# Patient Record
Sex: Female | Born: 1980 | Hispanic: Yes | Marital: Single | Smoking: Current every day smoker
Health system: Southern US, Community
[De-identification: ages and names within clinical notes are randomized; demographics above are authoritative.]

## PROBLEM LIST (undated history)

## (undated) DIAGNOSIS — R001 Bradycardia, unspecified: Secondary | ICD-10-CM

## (undated) DIAGNOSIS — J45909 Unspecified asthma, uncomplicated: Secondary | ICD-10-CM

---

## 2015-07-18 ENCOUNTER — Emergency Department (HOSPITAL_COMMUNITY)
Admission: EM | Admit: 2015-07-18 | Discharge: 2015-07-18 | Disposition: A | Discharge status: Home / Self Care | Payer: Medicaid Other | Attending: Emergency Medicine | Admitting: Emergency Medicine

## 2015-07-18 ENCOUNTER — Encounter (HOSPITAL_COMMUNITY): Payer: Self-pay | Admitting: Emergency Medicine

## 2015-07-18 ENCOUNTER — Emergency Department (HOSPITAL_COMMUNITY): Payer: Medicaid Other

## 2015-07-18 DIAGNOSIS — W108XXA Fall (on) (from) other stairs and steps, initial encounter: Secondary | ICD-10-CM | POA: Insufficient documentation

## 2015-07-18 DIAGNOSIS — S29001A Unspecified injury of muscle and tendon of front wall of thorax, initial encounter: Secondary | ICD-10-CM | POA: Diagnosis present

## 2015-07-18 DIAGNOSIS — Y9289 Other specified places as the place of occurrence of the external cause: Secondary | ICD-10-CM | POA: Insufficient documentation

## 2015-07-18 DIAGNOSIS — F17200 Nicotine dependence, unspecified, uncomplicated: Secondary | ICD-10-CM | POA: Insufficient documentation

## 2015-07-18 DIAGNOSIS — Y9389 Activity, other specified: Secondary | ICD-10-CM | POA: Diagnosis not present

## 2015-07-18 DIAGNOSIS — R0789 Other chest pain: Secondary | ICD-10-CM

## 2015-07-18 DIAGNOSIS — J45909 Unspecified asthma, uncomplicated: Secondary | ICD-10-CM | POA: Diagnosis not present

## 2015-07-18 DIAGNOSIS — W19XXXA Unspecified fall, initial encounter: Secondary | ICD-10-CM

## 2015-07-18 DIAGNOSIS — Y998 Other external cause status: Secondary | ICD-10-CM | POA: Insufficient documentation

## 2015-07-18 HISTORY — DX: Unspecified asthma, uncomplicated: J45.909

## 2015-07-18 LAB — I-STAT BETA HCG BLOOD, ED (MC, WL, AP ONLY)

## 2015-07-18 LAB — CBC WITH DIFFERENTIAL/PLATELET
BASOS PCT: 0 %
Basophils Absolute: 0 10*3/uL (ref 0.0–0.1)
EOS ABS: 0.3 10*3/uL (ref 0.0–0.7)
Eosinophils Relative: 3 %
HCT: 39.2 % (ref 36.0–46.0)
Hemoglobin: 13.2 g/dL (ref 12.0–15.0)
Lymphocytes Relative: 33 %
Lymphs Abs: 2.9 10*3/uL (ref 0.7–4.0)
MCH: 32.8 pg (ref 26.0–34.0)
MCHC: 33.7 g/dL (ref 30.0–36.0)
MCV: 97.3 fL (ref 78.0–100.0)
MONO ABS: 0.4 10*3/uL (ref 0.1–1.0)
Monocytes Relative: 4 %
NEUTROS PCT: 60 %
Neutro Abs: 5.3 10*3/uL (ref 1.7–7.7)
PLATELETS: 249 10*3/uL (ref 150–400)
RBC: 4.03 MIL/uL (ref 3.87–5.11)
RDW: 13.6 % (ref 11.5–15.5)
WBC: 8.9 10*3/uL (ref 4.0–10.5)

## 2015-07-18 LAB — BASIC METABOLIC PANEL
Anion gap: 8 (ref 5–15)
BUN: 8 mg/dL (ref 6–20)
CALCIUM: 9.7 mg/dL (ref 8.9–10.3)
CO2: 24 mmol/L (ref 22–32)
CREATININE: 0.58 mg/dL (ref 0.44–1.00)
Chloride: 105 mmol/L (ref 101–111)
GFR calc non Af Amer: >60 mL/min (ref >60)
Glucose, Bld: 90 mg/dL (ref 65–99)
Potassium: 4.3 mmol/L (ref 3.5–5.1)
SODIUM: 137 mmol/L (ref 135–145)

## 2015-07-18 MED ORDER — METHOCARBAMOL 500 MG PO TABS: 500.0000 mg | ORAL_TABLET | Freq: Two times a day (BID) | ORAL | Status: DC | PRN

## 2015-07-18 MED ORDER — HYDROCODONE-ACETAMINOPHEN 5-325 MG PO TABS
1.0000 | ORAL_TABLET | Freq: Once | ORAL | Status: AC
  Administered 2015-07-18: 1 via ORAL
  Filled 2015-07-18: qty 1

## 2015-07-18 MED ORDER — NAPROXEN 500 MG PO TABS: 500.0000 mg | ORAL_TABLET | Freq: Two times a day (BID) | ORAL | Status: DC

## 2015-07-18 MED ORDER — HYDROCODONE-ACETAMINOPHEN 5-325 MG PO TABS: 2.0000 | ORAL_TABLET | ORAL | Status: DC | PRN

## 2015-07-18 MED ORDER — KETOROLAC TROMETHAMINE 30 MG/ML IJ SOLN
30.0000 mg | Freq: Once | INTRAMUSCULAR | Status: AC
  Administered 2015-07-18: 30 mg via INTRAVENOUS
  Filled 2015-07-18: qty 1

## 2015-07-18 NOTE — ED Notes (Signed)
Pt stated that she slipped and fell down about four steps three days ago. Pt sustained injuries to her right flank and lower back. Pt denied a loss of consciousness before or after the incident.

## 2015-07-18 NOTE — ED Provider Notes (Signed)
CSN: [id number]     Arrival date & time 07/18/15  2018 History   First MD Initiated Contact with Patient 07/18/15 2021     Chief Complaint  Patient presents with  . Fall    HPI   Ms. Yolanda Chung is an 34 y.o. female with history of asthma who presents to the ED for evaluation of right sided pain following a fall. She states that three days ago she tripped and slid down some steps and since then has had right sided rib pain that is worse with movement and breathing. She states she has not noticed any bruising or swelling. She states she has been trying OTC NSAIDs with no relief. She denies SOB but states it hurts to take a deep breath. She states she has felt a little bit nauseated today due to the pain. Denies emesis. Pt states taht since the fall she has also had a cough that is productive of white phlegm. Denies fever, chills. Denies hitting her head or LOC. Denies weakness, difficulty walking, chest pain, abdominal pain.   Past Medical History  Diagnosis Date  . Asthma    History reviewed. No pertinent past surgical history. History reviewed. No pertinent family history. Social History  Substance Use Topics  . Smoking status: Current Every Day Smoker  . Smokeless tobacco: None  . Alcohol Use: No   OB History    No data available     Review of Systems  All other systems reviewed and are negative.     Allergies  Review of patient's allergies indicates no known allergies.  Home Medications   Prior to Admission medications   Medication Sig Start Date End Date Taking? Authorizing Provider  ibuprofen (ADVIL,MOTRIN) 200 MG tablet Take 400 mg by mouth every 6 (six) hours as needed for mild pain.   Yes Historical Provider, MD   Pulse 52  Temp(Src) 98.4 F (36.9 C) (Oral)  SpO2 100%  LMP 06/08/2015 Physical Exam  Constitutional: She is oriented to person, place, and time.  Thin, NAD  HENT:  Right Ear: External ear normal.  Left Ear: External ear normal.  Nose: Nose normal.   Mouth/Throat: Oropharynx is clear and moist. No oropharyngeal exudate.  Eyes: Conjunctivae and EOM are normal. Pupils are equal, round, and reactive to light.  Neck: Normal range of motion. Neck supple.  Cardiovascular: Normal rate, regular rhythm, normal heart sounds and intact distal pulses.   Pulmonary/Chest: Effort normal and breath sounds normal. No respiratory distress. She has no wheezes.  Pectus excavatum. Tenderness along right lateral side of chest.   Abdominal: Soft. Bowel sounds are normal. She exhibits no distension. There is no tenderness. There is no rebound and no guarding.  Musculoskeletal: Normal range of motion. She exhibits no edema.  Neurological: She is alert and oriented to person, place, and time. No cranial nerve deficit.  Skin: Skin is warm and dry.  Psychiatric: She has a normal mood and affect.  Nursing note and vitals reviewed.   ED Course  Procedures (including critical care time) Labs Review Labs Reviewed  BASIC METABOLIC PANEL  CBC WITH DIFFERENTIAL/PLATELET  I-STAT BETA HCG BLOOD, ED ([facility], [facility], [facility] ONLY)    Imaging Review Dg Chest 2 View  07/18/2015  CLINICAL DATA:  Initial encounter for Fall 3 days ago down the stiars. Pt c/o right lower rib and back pain. Pain with movement and inspiration. EXAM: CHEST  2 VIEW COMPARISON:  None. FINDINGS: Mild to moderate pectus excavatum deformity. Mild convex left thoracic  spine curvature. Midline trachea. Normal heart size and mediastinal contours. No pleural effusion or pneumothorax. EKG attachment artifacts identified projecting over both upper lobes. Clear lungs. IMPRESSION: No acute cardiopulmonary disease. Electronically Signed   By: Jeronimo Greaves M.D.   On: 07/18/2015 21:18   I have personally reviewed and evaluated these images and lab results as part of my medical decision-making.   EKG Interpretation None      MDM   Final diagnoses:  Fall, initial encounter  Chest wall pain   Pt presenting 3 days  after mechanical fall down stairs, now with right sided chest/rib pain. No gross deformity, swelling, or bruising. Pt is diffusely tender along lateral edge of chest. Will get CXR to r/o fracture, other lung injury. No abdominal tenderness. Will get basic labs as pt does also endorse nausea and productive cough. Will give norco and toradol for pain.  XR negative. Labs unremarkable. Likely contusion/soft tissue injury. Pt reports resolution of pain. Will give rx for norco, robaxin, and naproxen. ER return precautions given.    Carlene Coria, PA-C 07/18/15 2357  Arby Barrette, MD 07/20/15 [id number]

## 2015-07-18 NOTE — Discharge Instructions (Signed)
You were seen in the emergency room today for evaluation after a fall. Your x-ray was normal. As we discussed, your pain is likely due to swelling and bruising in the area. i will give you a few prescriptions to take home including Norco (strong painkiller), Robaxin (muscle relaxant), and naproxen (anti-inflammatory and pain killer). You may take them as needed. We also checked some bloodwork today which was normal. Please follow-up with your primary care provider within one week. Return to the ER for new or worsening symptoms.   Please obtain all of your results from medical records or have your doctors office obtain the results - share them with your doctor - you should be seen at your doctors office in the next 2 days. Call today to arrange your follow up. Take the medications as prescribed. Please review all of the medicines and only take them if you do not have an allergy to them. Please be aware that if you are taking birth control pills, taking other prescriptions, ESPECIALLY ANTIBIOTICS may make the birth control ineffective - if this is the case, either do not engage in sexual activity or use alternative methods of birth control such as condoms until you have finished the medicine and your family doctor says it is OK to restart them. If you are on a blood thinner such as COUMADIN, be aware that any other medicine that you take may cause the coumadin to either work too much, or not enough - you should have your coumadin level rechecked in next 7 days if this is the case.  ?  It is also a possibility that you have an allergic reaction to any of the medicines that you have been prescribed - Everybody reacts differently to medications and while MOST people have no trouble with most medicines, you may have a reaction such as nausea, vomiting, rash, swelling, shortness of breath. If this is the case, please stop taking the medicine immediately and contact your physician.  ?  You should return to the ER if  you develop severe or worsening symptoms.

## 2015-07-18 NOTE — ED Notes (Signed)
Pt left with all her belongings and ambulated out of the treatment area.  

## 2017-06-21 ENCOUNTER — Emergency Department (HOSPITAL_COMMUNITY)
Admission: EM | Admit: 2017-06-21 | Discharge: 2017-06-21 | Disposition: A | Discharge status: Home / Self Care | Payer: Self-pay | Attending: Emergency Medicine | Admitting: Emergency Medicine

## 2017-06-21 ENCOUNTER — Encounter (HOSPITAL_COMMUNITY): Payer: Self-pay | Admitting: Emergency Medicine

## 2017-06-21 ENCOUNTER — Other Ambulatory Visit: Payer: Self-pay

## 2017-06-21 ENCOUNTER — Emergency Department (HOSPITAL_COMMUNITY): Payer: Self-pay

## 2017-06-21 DIAGNOSIS — M62838 Other muscle spasm: Secondary | ICD-10-CM | POA: Insufficient documentation

## 2017-06-21 DIAGNOSIS — S161XXA Strain of muscle, fascia and tendon at neck level, initial encounter: Secondary | ICD-10-CM | POA: Insufficient documentation

## 2017-06-21 DIAGNOSIS — Y998 Other external cause status: Secondary | ICD-10-CM | POA: Insufficient documentation

## 2017-06-21 DIAGNOSIS — S0033XA Contusion of nose, initial encounter: Secondary | ICD-10-CM | POA: Insufficient documentation

## 2017-06-21 DIAGNOSIS — M25511 Pain in right shoulder: Secondary | ICD-10-CM | POA: Insufficient documentation

## 2017-06-21 DIAGNOSIS — Y9389 Activity, other specified: Secondary | ICD-10-CM | POA: Insufficient documentation

## 2017-06-21 DIAGNOSIS — Z79899 Other long term (current) drug therapy: Secondary | ICD-10-CM | POA: Insufficient documentation

## 2017-06-21 DIAGNOSIS — W06XXXA Fall from bed, initial encounter: Secondary | ICD-10-CM | POA: Insufficient documentation

## 2017-06-21 DIAGNOSIS — S0093XA Contusion of unspecified part of head, initial encounter: Secondary | ICD-10-CM | POA: Insufficient documentation

## 2017-06-21 DIAGNOSIS — Y92009 Unspecified place in unspecified non-institutional (private) residence as the place of occurrence of the external cause: Secondary | ICD-10-CM | POA: Insufficient documentation

## 2017-06-21 DIAGNOSIS — F17200 Nicotine dependence, unspecified, uncomplicated: Secondary | ICD-10-CM | POA: Insufficient documentation

## 2017-06-21 DIAGNOSIS — J45909 Unspecified asthma, uncomplicated: Secondary | ICD-10-CM | POA: Insufficient documentation

## 2017-06-21 DIAGNOSIS — S060X0A Concussion without loss of consciousness, initial encounter: Secondary | ICD-10-CM | POA: Insufficient documentation

## 2017-06-21 MED ORDER — NAPROXEN 500 MG PO TABS: 500.0000 mg | ORAL_TABLET | Freq: Two times a day (BID) | ORAL | 0 refills | Status: DC | PRN

## 2017-06-21 MED ORDER — KETOROLAC TROMETHAMINE 30 MG/ML IJ SOLN
30.0000 mg | Freq: Once | INTRAMUSCULAR | Status: AC
  Administered 2017-06-21: 30 mg via INTRAMUSCULAR
  Filled 2017-06-21: qty 1

## 2017-06-21 MED ORDER — CYCLOBENZAPRINE HCL 10 MG PO TABS: 10.0000 mg | ORAL_TABLET | Freq: Three times a day (TID) | ORAL | 0 refills | Status: DC | PRN

## 2017-06-21 NOTE — ED Triage Notes (Signed)
Pt states Thursday morning she got hit with an elbow on the right side of her face while sleeping on the top bunk of a bed, this made patient fall from top bunk landing on her right side. Pt c/o of back in right side of nose and upper back.

## 2017-06-21 NOTE — Discharge Instructions (Signed)
Alternate between naprosyn and Tylenol for pain. Get plenty of rest, use ice on your head.  Stay in a quiet, not simulating, dark environment. No TV, computer use, video games, or cell phone use until headache is resolved completely. Use heat to the area of soreness in your neck/shoulder area.  Use flexeril as directed as needed for muscle spasms, but don't drive or operate machinery while taking this medication. Follow Up with primary care physician in 5-7 days for recheck of symptoms.  Return to the emergency department if patient becomes lethargic, begins vomiting or other change in mental status, or any other changes/worsening symptoms.

## 2017-06-21 NOTE — ED Notes (Signed)
[email or web address] prescriptions printed for pt.

## 2017-06-21 NOTE — ED Provider Notes (Signed)
[facility]   CSN: [id number] Arrival date & time: 06/21/17  0820     History   Chief Complaint Chief Complaint  Patient presents with  . Facial Injury    HPI Yolanda Chung is a 36 y.o. female with a PMHx of asthma, who presents to the ED with complaints of right-sided headache/face pain times 2 days.  Patient states that she was sleeping on the top bunk with her child when she was accidentally elbowed on the right side of her nose, and this caused her to fall off the top bunk onto a carpeted floor landing on her face and right side.  She denies any LOC during the event.  She states that since then she has had 8/10 constant sharp nonradiating right periorbital/nasal headache, worse with palpation of the area, and minimally improved with ibuprofen.  She noticed some right-sided cheek and nose swelling which prompted her to come to the ER today.  She also complains of right-sided neck and shoulder pain where she fell.  She is not on any blood thinners.  She denies any vision changes, lightheadedness, LOC, fevers, chills, CP, SOB, abdominal pain, nausea, vomiting, diarrhea, constipation, dysuria, hematuria, incontinence of urine or stool, saddle anesthesia or cauda equina symptoms, numbness, tingling, focal weakness, or any other complaints at this time.  Of note, she does mention that she thought she was coming down with a cold earlier this week, and has had some rhinorrhea.    The history is provided by the patient and medical records. No language interpreter was used.  Facial Injury  Mechanism of injury:  Fall Location:  Nose and R cheek Time since incident:  2 days Pain details:    Quality:  Sharp   Severity:  Moderate   Duration:  2 days   Timing:  Constant   Progression:  Unchanged Foreign body present:  No foreign bodies Relieved by:  NSAIDs Worsened by:  Pressure Ineffective treatments:  None tried Associated symptoms:  headaches, neck pain and rhinorrhea   Associated symptoms: no loss of consciousness, no nausea and no vomiting     Past Medical History:  Diagnosis Date  . Asthma     There are no active problems to display for this patient.   History reviewed. No pertinent surgical history.  OB History    No data available       Home Medications    Prior to Admission medications   Medication Sig Start Date End Date Taking? Authorizing Provider  HYDROcodone-acetaminophen (NORCO/VICODIN) 5-325 MG tablet Take 2 tablets by mouth every 4 (four) hours as needed. 07/18/15   Sam, Ace GinsSerena Y, PA-C  ibuprofen (ADVIL,MOTRIN) 200 MG tablet Take 400 mg by mouth every 6 (six) hours as needed for mild pain.    [provider]  methocarbamol (ROBAXIN) 500 MG tablet Take 1 tablet (500 mg total) by mouth 2 (two) times daily as needed for muscle spasms. 07/18/15   Sam, Ace GinsSerena Y, PA-C  naproxen (NAPROSYN) 500 MG tablet Take 1 tablet (500 mg total) by mouth 2 (two) times daily. 07/18/15   Carlene CoriaSam, Serena Y, PA-C    Family History No family history on file.  Social History Social History   Tobacco Use  . Smoking status: Current Every Day Smoker  Substance Use Topics  . Alcohol use: No  . Drug use: No     Allergies   Patient has no known allergies.   Review of Systems Review of Systems  Constitutional: Negative for chills and fever.  HENT: Positive for facial swelling and rhinorrhea.   Eyes: Negative for visual disturbance.  Respiratory: Negative for shortness of breath.   Cardiovascular: Negative for chest pain.  Gastrointestinal: Negative for abdominal pain, constipation, diarrhea, nausea and vomiting.  Genitourinary: Negative for difficulty urinating (no incontinence), dysuria and hematuria.  Musculoskeletal: Positive for myalgias and neck pain. Negative for arthralgias.  Skin: Negative for color change.  Allergic/Immunologic: Negative for immunocompromised state.  Neurological: Positive  for headaches. Negative for loss of consciousness, syncope, weakness, light-headedness and numbness.  Hematological: Does not bruise/bleed easily.  Psychiatric/Behavioral: Negative for confusion.   All other systems reviewed and are negative for acute change except as noted in the HPI.    Physical Exam Updated Vital Signs BP 132/84 (BP Location: Right Arm)   Pulse (!) 59   Temp 98.6 F (37 C) (Oral)   Resp 16   Ht 4\' 10"  (1.473 m)   Wt 37.2 kg (82 lb)   SpO2 100%   BMI 17.14 kg/m   Physical Exam  Constitutional: She is oriented to person, place, and time. Vital signs are normal. She appears well-developed and well-nourished.  Non-toxic appearance. No distress.  Afebrile, nontoxic, NAD  HENT:  Head: Normocephalic. Head is with contusion. Head is without raccoon's eyes, without Battle's sign and without abrasion.  Nose: Sinus tenderness present. No nasal deformity, septal deviation or nasal septal hematoma. No epistaxis.  Mouth/Throat: Uvula is midline, oropharynx is clear and moist and mucous membranes are normal. No trismus in the jaw. No uvula swelling.  Slight swelling to R cheek/nasal bridge, mild TTP in this area, no bruising, no crepitus or deformity, no skull tenderness or bony step offs, no abrasions or lacerations, no raccoon eyes or battle's sign. No s/sx of basilar skull fx. No jaw tenderness, FROM intact at b/l TMJs, no malocclusion  Eyes: Conjunctivae and EOM are normal. Pupils are equal, round, and reactive to light. Right eye exhibits no discharge. Left eye exhibits no discharge.  PERRL, EOMI, no nystagmus, no visual field deficits   Neck: Normal range of motion. Neck supple. Spinous process tenderness and muscular tenderness present. No neck rigidity. Normal range of motion present.  FROM intact with diffuse midline spinous process TTP, no bony stepoffs or deformities, and with R sided paraspinous muscle TTP and muscle spasms. No rigidity or meningeal signs. No bruising  or swelling.   Cardiovascular: Normal rate and intact distal pulses.  Pulmonary/Chest: Effort normal. No respiratory distress.  Abdominal: Normal appearance. She exhibits no distension.  Musculoskeletal: Normal range of motion.  MAE x4 Strength and sensation grossly intact in all extremities Distal pulses intact Gait steady C-spine as above, all other spinal levels nonTTP without bony stepoffs or deformities   Neurological: She is alert and oriented to person, place, and time. She has normal strength. No cranial nerve deficit or sensory deficit. Coordination and gait normal. GCS eye subscore is 4. GCS verbal subscore is 5. GCS motor subscore is 6.  CN 2-12 grossly intact A&O x4 GCS 15 Sensation and strength intact Gait nonataxic including with tandem walking Coordination with finger-to-nose WNL Neg pronator drift   Skin: Skin is warm, dry and intact. No rash noted.  Psychiatric: She has a normal mood and affect.  Nursing note and vitals reviewed.    ED Treatments / Results  Labs (all labs ordered are listed, but only abnormal results are displayed) Labs Reviewed - No data to display  EKG  EKG Interpretation  None       Radiology Dg Cervical Spine Complete  Result Date: 06/21/2017 CLINICAL DATA:  Fall from bed.  Neck pain EXAM: CERVICAL SPINE - COMPLETE 4+ VIEW COMPARISON:  None. FINDINGS: Normal alignment. No fracture. Prevertebral soft tissues are normal. Disc spaces maintained. IMPRESSION: Negative cervical spine radiographs. Electronically Signed   By: Charlett NoseKevin  Dover M.D.   On: 06/21/2017 13:00    Procedures Procedures (including critical care time)  Medications Ordered in ED Medications  ketorolac (TORADOL) 30 MG/ML injection 30 mg (30 mg Intramuscular Given 06/21/17 1220)     Initial Impression / Assessment and Plan / ED Course  I have reviewed the triage vital signs and the nursing notes.  Pertinent labs & imaging results that were available during my care of  the patient were reviewed by me and considered in my medical decision making (see chart for details).     36 y.o. female here after she got elbowed in the nose/face and then fell out of the top bunk onto her face 2 days ago. No LOC. On exam, no focal neuro deficits, mild midline cervical spinous process TTP and R sided paracervical muscle TTP with spasm, mild tenderness to nasal bridge, no epistaxis or septum hematoma/deviation. No s/sx of basilar skull fx. Will get xray of C-spine but doubt need for head imaging. Will give toradol then reassess shortly.   2:17 PM Xray negative. Pt feeling better. Likely mild concussion and contusion of nose, and muscle strain in neck; advised concussion guidelines and use of tylenol/ice to face/heat to neck, will send home with flexeril and naprosyn rx's. Advised f/up with PCP in 1wk for recheck of symptoms. I explained the diagnosis and have given explicit precautions to return to the ER including for any other new or worsening symptoms. The patient understands and accepts the medical plan as it's been dictated and I have answered their questions. Discharge instructions concerning home care and prescriptions have been given. The patient is STABLE and is discharged to home in good condition.    Final Clinical Impressions(s) / ED Diagnoses   Final diagnoses:  Contusion of nose, initial encounter  Concussion without loss of consciousness, initial encounter  Neck muscle spasm    ED Discharge Orders        Ordered    naproxen (NAPROSYN) 500 MG tablet  2 times daily PRN     06/21/17 1331    cyclobenzaprine (FLEXERIL) 10 MG tablet  3 times daily PRN     06/21/17 [address]       Karan Ramnauth, EdgemontMercedes, [place] JerseyPA-C 06/21/17 1417    Cathren LaineSteinl, Kevin, MD 06/22/17 [phone]

## 2017-07-03 ENCOUNTER — Emergency Department (HOSPITAL_COMMUNITY): Payer: Self-pay

## 2017-07-03 ENCOUNTER — Encounter (HOSPITAL_COMMUNITY): Payer: Self-pay

## 2017-07-03 ENCOUNTER — Emergency Department (HOSPITAL_COMMUNITY)
Admission: EM | Admit: 2017-07-03 | Discharge: 2017-07-03 | Disposition: A | Discharge status: Home / Self Care | Payer: Self-pay | Attending: Emergency Medicine | Admitting: Emergency Medicine

## 2017-07-03 DIAGNOSIS — J189 Pneumonia, unspecified organism: Secondary | ICD-10-CM

## 2017-07-03 DIAGNOSIS — J181 Lobar pneumonia, unspecified organism: Secondary | ICD-10-CM | POA: Insufficient documentation

## 2017-07-03 DIAGNOSIS — F17210 Nicotine dependence, cigarettes, uncomplicated: Secondary | ICD-10-CM | POA: Insufficient documentation

## 2017-07-03 DIAGNOSIS — Z79899 Other long term (current) drug therapy: Secondary | ICD-10-CM | POA: Insufficient documentation

## 2017-07-03 DIAGNOSIS — J45909 Unspecified asthma, uncomplicated: Secondary | ICD-10-CM | POA: Insufficient documentation

## 2017-07-03 LAB — RAPID STREP SCREEN (MED CTR MEBANE ONLY): Streptococcus, Group A Screen (Direct): NEGATIVE

## 2017-07-03 MED ORDER — ALBUTEROL SULFATE HFA 108 (90 BASE) MCG/ACT IN AERS: 2.0000 | INHALATION_SPRAY | RESPIRATORY_TRACT | 0 refills | Status: DC | PRN

## 2017-07-03 MED ORDER — AZITHROMYCIN 250 MG PO TABS: 250.0000 mg | ORAL_TABLET | Freq: Every day | ORAL | 0 refills | Status: DC

## 2017-07-03 MED ORDER — IBUPROFEN 400 MG PO TABS: 400.0000 mg | ORAL_TABLET | Freq: Four times a day (QID) | ORAL | 0 refills | Status: DC | PRN

## 2017-07-03 NOTE — ED Provider Notes (Signed)
[facility] Provider [facility]   CSN: [id number] Arrival date & time: 07/03/17  0608     History   Chief Complaint Chief Complaint  Patient presents with  . Cough    HPI Yolanda Chung is a 36 y.o. female.  HPI   Yolanda Chung is a 36 y.o. female, with a history of asthma, presenting to the ED with productive cough for last five days. Accompanied by body aches, fever, and chills.  She has been taking Tylenol for discomfort. No recent hospitalizations or ABX use. Denies CP, SOB, vomiting/diarrhea, abdominal pain, or any other complaints.      Past Medical History:  Diagnosis Date  . Asthma     There are no active problems to display for this patient.   History reviewed. No pertinent surgical history.  OB History    No data available       Home Medications    Prior to Admission medications   Medication Sig Start Date End Date Taking? Authorizing Provider  albuterol (PROVENTIL HFA;VENTOLIN HFA) 108 (90 Base) MCG/ACT inhaler Inhale 2 puffs into the lungs every 4 (four) hours as needed for wheezing or shortness of breath. 07/03/17   Zykera Abella C, PA-C  azithromycin (ZITHROMAX) 250 MG tablet Take 1 tablet (250 mg total) by mouth daily. Take first 2 tablets together, then 1 every day until finished. 07/03/17   Maleena Eddleman C, PA-C  cyclobenzaprine (FLEXERIL) 10 MG tablet Take 1 tablet (10 mg total) 3 (three) times daily as needed by mouth for muscle spasms. 06/21/17   Street, SunmanMercedes, PA-C  HYDROcodone-acetaminophen (NORCO/VICODIN) 5-325 MG tablet Take 2 tablets by mouth every 4 (four) hours as needed. 07/18/15   Sam, Ace GinsSerena Y, PA-C  ibuprofen (ADVIL,MOTRIN) 200 MG tablet Take 400 mg by mouth every 6 (six) hours as needed for mild pain.    [provider]  ibuprofen (ADVIL,MOTRIN) 400 MG tablet Take 1 tablet (400 mg total) by mouth every 6 (six) hours as needed for fever, mild pain or moderate pain. 07/03/17   Radiah Lubinski C, PA-C    methocarbamol (ROBAXIN) 500 MG tablet Take 1 tablet (500 mg total) by mouth 2 (two) times daily as needed for muscle spasms. 07/18/15   Sam, Ace GinsSerena Y, PA-C  naproxen (NAPROSYN) 500 MG tablet Take 1 tablet (500 mg total) by mouth 2 (two) times daily. 07/18/15   Sam, Ace GinsSerena Y, PA-C  naproxen (NAPROSYN) 500 MG tablet Take 1 tablet (500 mg total) 2 (two) times daily as needed by mouth for mild pain, moderate pain or headache (TAKE WITH MEALS.). 06/21/17   Street, DelmarMercedes, PA-C    Family History No family history on file.  Social History Social History   Tobacco Use  . Smoking status: Current Every Day Smoker  . Smokeless tobacco: Never Used  Substance Use Topics  . Alcohol use: No  . Drug use: No     Allergies   Patient has no known allergies.   Review of Systems Review of Systems  Constitutional: Positive for chills and fever.  Respiratory: Positive for cough. Negative for shortness of breath.   Gastrointestinal: Negative for abdominal pain, diarrhea, nausea and vomiting.  All other systems reviewed and are negative.    Physical Exam Updated Vital Signs BP 110/73   Pulse 76   Temp 98.3 F (36.8 C) (Oral)   Resp 18   LMP 06/21/2017 (Exact Date)   SpO2 99%   Physical Exam  Constitutional: She appears well-developed and  well-nourished. No distress.  HENT:  Head: Normocephalic and atraumatic.  Eyes: Conjunctivae are normal.  Neck: Neck supple.  Cardiovascular: Normal rate, regular rhythm, normal heart sounds and intact distal pulses.  Pulmonary/Chest: Effort normal and breath sounds normal. No respiratory distress.  No increased work of breathing. Speaks in full sentences without difficulty.  Abdominal: Soft. There is no tenderness. There is no guarding.  Musculoskeletal: She exhibits no edema.  Lymphadenopathy:    She has no cervical adenopathy.  Neurological: She is alert.  Skin: Skin is warm and dry. Capillary refill takes less than 2 seconds. She is not  diaphoretic.  Psychiatric: She has a normal mood and affect. Her behavior is normal.  Nursing note and vitals reviewed.    ED Treatments / Results  Labs (all labs ordered are listed, but only abnormal results are displayed) Labs Reviewed  RAPID STREP SCREEN (NOT AT [facility])  CULTURE, GROUP A STREP [facility])    EKG  EKG Interpretation None       Radiology Dg Chest 2 View  Result Date: 07/03/2017 CLINICAL DATA:  Productive cough, fever and sore throat for 5 days. EXAM: CHEST  2 VIEW COMPARISON:  07/18/2015 FINDINGS: Borderline hyperinflation. The cardiomediastinal contours are normal. Small focal retrocardiac opacity in the left lung base likely localizing to the lingula on the lateral view. Pulmonary vasculature is normal. No pleural effusion or pneumothorax. No acute osseous abnormalities are seen. IMPRESSION: Small focal left lung base opacity suspicious for pneumonia. Electronically Signed   By: Rubye OaksMelanie  Ehinger M.D.   On: 07/03/2017 06:54    Procedures Procedures (including critical care time)  Medications Ordered in ED Medications - No data to display   Initial Impression / Assessment and Plan / ED Course  I have reviewed the triage vital signs and the nursing notes.  Pertinent labs & imaging results that were available during my care of the patient were reviewed by me and considered in my medical decision making (see chart for details).     Patient presents with productive cough and fever.  Evidence of pneumonia on chest x-ray.  Antibiotic therapy initiated.  PCP follow-up. The patient was given instructions for home care as well as return precautions. Patient voices understanding of these instructions, accepts the plan, and is comfortable with discharge.   Final Clinical Impressions(s) / ED Diagnoses   Final diagnoses:  Community acquired pneumonia of left lower lobe of lung [facility](HCC)    ED Discharge Orders        Ordered    ibuprofen (ADVIL,MOTRIN) 400 MG tablet   Every 6 hours PRN     07/03/17 0935    azithromycin (ZITHROMAX) 250 MG tablet  Daily     07/03/17 0935    albuterol (PROVENTIL HFA;VENTOLIN HFA) 108 (90 Base) MCG/ACT inhaler  Every 4 hours PRN     07/03/17 0935       Anselm PancoastJoy, Cherye Gaertner C, PA-C 07/03/17 0939    Gerhard MunchLockwood, Robert, MD 07/03/17 1550

## 2017-07-03 NOTE — ED Triage Notes (Addendum)
Pt reports productive cough, sore throat and fevers for 5 days associated with lack of appetite. She states she is getting worse every day. Temp at home 101.3 yesterday morning. Denies nausea/vomiting/diarrhea.

## 2017-07-03 NOTE — ED Notes (Signed)
Pt staets she understands instructions. Home stable with steady gait.

## 2017-07-03 NOTE — Discharge Instructions (Addendum)
You have evidence of pneumonia on the chest x-ray.  Please follow the instructions below.  Please take all of your antibiotics until finished!   You may develop abdominal discomfort or diarrhea from the antibiotic.  You may help offset this with probiotics which you can buy or get in yogurt. Do not eat or take the probiotics until 2 hours after your antibiotic.   Hand washing: Wash your hands throughout the day, but especially before and after touching the face, using the restroom, sneezing, coughing, or touching surfaces that have been coughed or sneezed upon. Hydration: Symptoms will be intensified and complicated by dehydration. Dehydration can also extend the duration of symptoms. Drink plenty of fluids and get plenty of rest. You should be drinking at least half a liter of water an hour to stay hydrated. Electrolyte drinks are also encouraged. You should be drinking enough fluids to make your urine light yellow, almost clear. If this is not the case, you are not drinking enough water. Please note that some of the treatments indicated below will not be effective if you are not adequately hydrated. Pain or fever: Ibuprofen, Naproxen, or Tylenol for pain or fever.  Antiinflammatory medications: Take 400 mg of ibuprofen every 6 hours for the next 3 days. After this time, this medication may be used as needed for pain. Take this medication with food to avoid upset stomach.  Tylenol: Should you continue to have additional pain while taking the ibuprofen, you may add in tylenol as needed. Your daily total maximum amount of tylenol from all sources should be limited to 4000mg /day for persons without liver problems, or 2000mg /day for those with liver problems. Albuterol: May use the albuterol, as needed, for mild shortness of breath. Congestion: Plain Mucinex may help relieve congestion. Saline sinus rinses and saline nasal sprays may also help relieve congestion. If you do not have heart problems or an  allergy to such medications, you may also try phenylephrine or Sudafed. Sore throat: Warm liquids or Chloraseptic spray may help soothe a sore throat. Gargle twice a day with a salt water solution made from a half teaspoon of salt in a cup of warm water.  Follow up: Follow up with a primary care provider, as needed, for any future management of this issue. Return: Return to the ED should symptoms worsen.

## 2017-07-05 ENCOUNTER — Ambulatory Visit (HOSPITAL_COMMUNITY)
Admission: EM | Admit: 2017-07-05 | Discharge: 2017-07-05 | Disposition: A | Discharge status: Home / Self Care | Payer: Self-pay | Attending: Internal Medicine | Admitting: Internal Medicine

## 2017-07-05 ENCOUNTER — Encounter (HOSPITAL_COMMUNITY): Payer: Self-pay | Admitting: Emergency Medicine

## 2017-07-05 DIAGNOSIS — J189 Pneumonia, unspecified organism: Secondary | ICD-10-CM

## 2017-07-05 DIAGNOSIS — J181 Lobar pneumonia, unspecified organism: Secondary | ICD-10-CM

## 2017-07-05 LAB — CULTURE, GROUP A STREP (THRC)

## 2017-07-05 NOTE — ED Triage Notes (Signed)
PT C/O: persistent cold/pneumonia... [facility] on 11/29   ONSET: 7 days   SX ALSO INCLUDE: prod cough, fevers  DENIES:   TAKING MEDS:   A&O x4... NAD... Ambulatory

## 2017-07-05 NOTE — Discharge Instructions (Addendum)
Please do not start smoking again.  Finish zithromax as prescribed in ED 11/29.  Note for work extended for 2 more days, until 07/07/2017; return to work on 07/08/2017.  Rest and push fluids.  Anticipate gradual improvement in cough/phlegm production/fever/energy level over the next 2-4 weeks; would not expect increasing phlegm production or persistent fever more than 3-4 more days.

## 2017-07-05 NOTE — ED Provider Notes (Signed)
[facility]    CSN: [id number] Arrival date & time: 07/05/17  1431     History   Chief Complaint Chief Complaint  Patient presents with  . Follow-up    Pneumonia     HPI Johna RolesGloria Tabone is a 36 y.o. female.   She presents today in follow-up from an ER visit 2 days ago, where she was diagnosed with left lower lobe pneumonia.  She thought she had the flu at presentation.  She was prescribed Zithromax and given a work note.  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  Fever is improving, but had a temp this morning of 101.  Still some malaise.  Not currently smoking.    HPI  Past Medical History:  Diagnosis Date  . Asthma     History reviewed. No pertinent surgical history.     Home Medications    Prior to Admission medications   Medication Sig Start Date End Date Taking? Authorizing Provider  albuterol (PROVENTIL HFA;VENTOLIN HFA) 108 (90 Base) MCG/ACT inhaler Inhale 2 puffs into the lungs every 4 (four) hours as needed for wheezing or shortness of breath. 07/03/17  Yes Joy, Shawn C, PA-C  azithromycin (ZITHROMAX) 250 MG tablet Take 1 tablet (250 mg total) by mouth daily. Take first 2 tablets together, then 1 every day until finished. 07/03/17  Yes Joy, Shawn C, PA-C  cyclobenzaprine (FLEXERIL) 10 MG tablet Take 1 tablet (10 mg total) 3 (three) times daily as needed by mouth for muscle spasms. 06/21/17  Yes Street, AlbaMercedes, PA-C  HYDROcodone-acetaminophen (NORCO/VICODIN) 5-325 MG tablet Take 2 tablets by mouth every 4 (four) hours as needed. 07/18/15   Sam, Ace GinsSerena Y, PA-C  ibuprofen (ADVIL,MOTRIN) 200 MG tablet Take 400 mg by mouth every 6 (six) hours as needed for mild pain.    [provider]  ibuprofen (ADVIL,MOTRIN) 400 MG tablet Take 1 tablet (400 mg total) by mouth every 6 (six) hours as needed for fever, mild pain or  moderate pain. 07/03/17   Joy, Shawn C, PA-C  methocarbamol (ROBAXIN) 500 MG tablet Take 1 tablet (500 mg total) by mouth 2 (two) times daily as needed for muscle spasms. 07/18/15   Sam, Ace GinsSerena Y, PA-C  naproxen (NAPROSYN) 500 MG tablet Take 1 tablet (500 mg total) by mouth 2 (two) times daily. 07/18/15   Sam, Ace GinsSerena Y, PA-C  naproxen (NAPROSYN) 500 MG tablet Take 1 tablet (500 mg total) 2 (two) times daily as needed by mouth for mild pain, moderate pain or headache (TAKE WITH MEALS.). 06/21/17   Street, [place]cedes, PA-C    Family History Family History  Problem Relation Age of Onset  . Seizures Mother     Social History Social History   Tobacco Use  . Smoking status: Current Every Day Smoker  . Smokeless tobacco: Never Used  Substance Use Topics  . Alcohol use: No  . Drug use: No     Allergies   Patient has no known allergies.   Review of Systems Review of Systems  All other systems reviewed and are negative.    Physical Exam Triage Vital Signs ED Triage Vitals  Enc Vitals Group     BP 07/05/17 1508 114/73     Pulse Rate 07/05/17 1508 62     Resp 07/05/17 1508 20     Temp 07/05/17 1508 97.9 F (36.6 C)  Temp Source 07/05/17 1508 Oral     SpO2 07/05/17 1508 100 %     Weight --      Height --      Pain Score 07/05/17 1510 7     Pain Loc --    Updated Vital Signs BP 114/73 (BP Location: Left Arm)   Pulse 62   Temp 97.9 F (36.6 C) (Oral)   Resp 20   LMP 06/21/2017 (Exact Date)   SpO2 100%   Physical Exam  Constitutional: She is oriented to person, place, and time.  Appears chronically ill  HENT:  Head: Atraumatic.  Bilateral TMs are moderately dull, flushed pink Mild to moderate nasal congestion Throat is slightly injected  Eyes:  Conjugate gaze observed, no eye redness/discharge  Neck: Neck supple.  Cardiovascular: Normal rate and regular rhythm.  Pulmonary/Chest: No respiratory distress. She has no wheezes. She has no rales.  Coarse but  symmetric breath sounds throughout Frequent deep productive cough during exam  Abdominal: She exhibits no distension.  Musculoskeletal: Normal range of motion.  Neurological: She is alert and oriented to person, place, and time.  Skin: Skin is warm and dry.  Nursing note and vitals reviewed.    UC Treatments / Results   Procedures Procedures (including critical care time) None today  Final Clinical Impressions(s) / UC Diagnoses   Final diagnoses:  Community acquired pneumonia of left lower lobe of lung (HCC)   Please do not start smoking again.  Finish zithromax as prescribed in ED 11/29.  Note for work extended for 2 more days, until 07/07/2017; return to work on 07/08/2017.  Rest and push fluids.  Anticipate gradual improvement in cough/phlegm production/fever/energy level over the next 2-4 weeks; would not expect increasing phlegm production or persistent fever more than 3-4 more days.     Controlled Substance Prescriptions [place] Controlled Substance Registry consulted? No   Eustace MooreMurray, Tylan Briguglio W, MD 07/05/17 2057

## 2018-03-09 ENCOUNTER — Emergency Department (HOSPITAL_COMMUNITY): Admission: EM | Admit: 2018-03-09 | Discharge: 2018-03-09 | Discharge status: Against Medical Advice | Payer: Self-pay

## 2018-03-11 ENCOUNTER — Encounter (HOSPITAL_COMMUNITY): Payer: Self-pay

## 2018-03-11 ENCOUNTER — Other Ambulatory Visit: Payer: Self-pay

## 2018-03-11 ENCOUNTER — Emergency Department (HOSPITAL_COMMUNITY)
Admission: EM | Admit: 2018-03-11 | Discharge: 2018-03-11 | Disposition: A | Discharge status: Home / Self Care | Payer: Self-pay | Attending: Emergency Medicine | Admitting: Emergency Medicine

## 2018-03-11 DIAGNOSIS — N3001 Acute cystitis with hematuria: Secondary | ICD-10-CM | POA: Insufficient documentation

## 2018-03-11 DIAGNOSIS — J45909 Unspecified asthma, uncomplicated: Secondary | ICD-10-CM | POA: Insufficient documentation

## 2018-03-11 DIAGNOSIS — F17200 Nicotine dependence, unspecified, uncomplicated: Secondary | ICD-10-CM | POA: Insufficient documentation

## 2018-03-11 DIAGNOSIS — M545 Low back pain, unspecified: Secondary | ICD-10-CM

## 2018-03-11 DIAGNOSIS — Z79899 Other long term (current) drug therapy: Secondary | ICD-10-CM | POA: Insufficient documentation

## 2018-03-11 LAB — URINALYSIS, ROUTINE W REFLEX MICROSCOPIC
Bilirubin Urine: NEGATIVE
GLUCOSE, UA: NEGATIVE mg/dL
KETONES UR: NEGATIVE mg/dL
Nitrite: NEGATIVE
PH: 6 (ref 5.0–8.0)
PROTEIN: NEGATIVE mg/dL
Specific Gravity, Urine: 1.016 (ref 1.005–1.030)

## 2018-03-11 MED ORDER — CEPHALEXIN 500 MG PO CAPS: ORAL_CAPSULE | ORAL | 0 refills | Status: DC

## 2018-03-11 MED ORDER — CYCLOBENZAPRINE HCL 5 MG PO TABS: 5.0000 mg | ORAL_TABLET | Freq: Two times a day (BID) | ORAL | 0 refills | Status: DC | PRN

## 2018-03-11 MED ORDER — MELOXICAM 7.5 MG PO TABS: 7.5000 mg | ORAL_TABLET | Freq: Two times a day (BID) | ORAL | 0 refills | Status: DC

## 2018-03-11 MED ORDER — FOSFOMYCIN TROMETHAMINE 3 G PO PACK: 3.0000 g | PACK | Freq: Once | ORAL | Status: DC

## 2018-03-11 NOTE — Discharge Instructions (Signed)
Your urinalysis shows a UTI. Please complete the course of antibiotics.  You may take tylenol with the pain medication I have prescribed, do not take any other over the counter medications with this medicine. SEEK IMMEDIATE MEDICAL ATTENTION IF: New numbness, tingling, weakness, or problem with the use of your arms or legs.  Severe back pain not relieved with medications.  Change in bowel or bladder control.  Increasing pain in any areas of the body (such as chest or abdominal pain).  Shortness of breath, dizziness or fainting.  Nausea (feeling sick to your stomach), vomiting, fever, or sweats.   SEEK MEDICAL CARE IF:  You have back pain.  You develop a fever.  Your symptoms do not begin to resolve within 3 days.  SEEK IMMEDIATE MEDICAL CARE IF:  You have severe back pain or lower abdominal pain.  You develop chills.  You have nausea or vomiting.  You have continued burning or discomfort with urination.

## 2018-03-11 NOTE — ED Notes (Signed)
Pt states unable to give urine sample at this time 

## 2018-03-11 NOTE — ED Triage Notes (Signed)
Patient complains of doing a lot of lifting at work and home and lower back pain x 4 days with movement and inspiration, no relief with excedrin

## 2018-03-11 NOTE — ED Provider Notes (Signed)
[facility] EMERGENCY DEPARTMENT Provider Note   CSN: [id number] Arrival date & time: 03/11/18  0945     History   Chief Complaint Chief Complaint  Patient presents with  . Back Pain    HPI Yolanda Chung is a 37 y.o. female who presents to the emergency department for evaluation of lumbar spine pain.  Patient states that she first noticed her low back pain 4 nights ago after she carried a bunch of heavy groceries up her steps into her home.  She says that she felt a little bit of achiness bilaterally however in the mornings when she awoke she had stiffness and pain in her lumbar region.  It is worse when she twists or moves.  She rates her pain currently at 7 out of 10 but states that at night her pain is 10 out of 10 she is having difficulty sleeping.  She has been taking ibuprofen without relief of her symptoms.  She denies any saddle anesthesia, leg weakness or urinary symptoms.  She denies loss of bowel or bladder continence.  HPI  Past Medical History:  Diagnosis Date  . Asthma     There are no active problems to display for this patient.   History reviewed. No pertinent surgical history.   OB History   None      Home Medications    Prior to Admission medications   Medication Sig Start Date End Date Taking? Authorizing Provider  albuterol (PROVENTIL HFA;VENTOLIN HFA) 108 (90 Base) MCG/ACT inhaler Inhale 2 puffs into the lungs every 4 (four) hours as needed for wheezing or shortness of breath. 07/03/17   Joy, Shawn C, PA-C  azithromycin (ZITHROMAX) 250 MG tablet Take 1 tablet (250 mg total) by mouth daily. Take first 2 tablets together, then 1 every day until finished. 07/03/17   Joy, Shawn C, PA-C  cyclobenzaprine (FLEXERIL) 10 MG tablet Take 1 tablet (10 mg total) 3 (three) times daily as needed by mouth for muscle spasms. 06/21/17   Street, HuntleyMercedes, PA-C  HYDROcodone-acetaminophen (NORCO/VICODIN) 5-325 MG tablet Take 2 tablets by mouth every 4  (four) hours as needed. 07/18/15   Sam, Ace GinsSerena Y, PA-C  ibuprofen (ADVIL,MOTRIN) 200 MG tablet Take 400 mg by mouth every 6 (six) hours as needed for mild pain.    [provider]  ibuprofen (ADVIL,MOTRIN) 400 MG tablet Take 1 tablet (400 mg total) by mouth every 6 (six) hours as needed for fever, mild pain or moderate pain. 07/03/17   Joy, Shawn C, PA-C  methocarbamol (ROBAXIN) 500 MG tablet Take 1 tablet (500 mg total) by mouth 2 (two) times daily as needed for muscle spasms. 07/18/15   Sam, Ace GinsSerena Y, PA-C  naproxen (NAPROSYN) 500 MG tablet Take 1 tablet (500 mg total) by mouth 2 (two) times daily. 07/18/15   Sam, Ace GinsSerena Y, PA-C  naproxen (NAPROSYN) 500 MG tablet Take 1 tablet (500 mg total) 2 (two) times daily as needed by mouth for mild pain, moderate pain or headache (TAKE WITH MEALS.). 06/21/17   Street, [place], PA-C    Family History Family History  Problem Relation Age of Onset  . Seizures Mother     Social History Social History   Tobacco Use  . Smoking status: Current Every Day Smoker  . Smokeless tobacco: Never Used  Substance Use Topics  . Alcohol use: No  . Drug use: No     Allergies   Patient has no known allergies.   Review of Systems Review of  Systems Ten systems reviewed and are negative for acute change, except as noted in the HPI.    Physical Exam Updated Vital Signs BP (!) 160/100   Pulse 62   Temp (!) 97.5 F (36.4 C) (Oral)   Resp 16   SpO2 100%   Physical Exam  Constitutional: She is oriented to person, place, and time. She appears well-developed and well-nourished. No distress.  HENT:  Head: Normocephalic and atraumatic.  Eyes: Conjunctivae are normal. No scleral icterus.  Neck: Normal range of motion.  Cardiovascular: Normal rate, regular rhythm and normal heart sounds. Exam reveals no gallop and no friction rub.  No murmur heard. Pulmonary/Chest: Effort normal and breath sounds normal. No respiratory distress.  Abdominal: Soft.  Bowel sounds are normal. She exhibits no distension and no mass. There is no tenderness. There is no guarding.  Musculoskeletal:  Patient appears to be in mild to moderate pain, antalgic gait noted. Lumbosacral spine area reveals no local tenderness or mass. Painful and reduced LS ROM noted. Straight leg raise is negative. DTR's, motor strength and sensation normal, including heel and toe gait.  Peripheral pulses are palpable.   Neurological: She is alert and oriented to person, place, and time.  Skin: Skin is warm and dry. She is not diaphoretic.  Psychiatric: Her behavior is normal.  Nursing note and vitals reviewed.    ED Treatments / Results  Labs (all labs ordered are listed, but only abnormal results are displayed) Labs Reviewed - No data to display  EKG None  Radiology No results found.  Procedures Procedures (including critical care time)  Medications Ordered in ED Medications - No data to display   Initial Impression / Assessment and Plan / ED Course  I have reviewed the triage vital signs and the nursing notes.  Pertinent labs & imaging results that were available during my care of the patient were reviewed by me and considered in my medical decision making (see chart for details).     It with potential urinary tract infection.  Will treat with Keflex.  Otherwise she appears to have musculoskeletal pain without any red flag symptoms.Patient with back pain.  No neurological deficits and normal neuro exam.  Patient can walk but states is painful.  No loss of bowel or bladder control.  No concern for cauda equina.  No fever, night sweats, weight loss, h/o cancer, IVDU.  RICE protocol and pain medicine indicated and discussed with patient.    Final Clinical Impressions(s) / ED Diagnoses   Final diagnoses:  Acute bilateral low back pain without sciatica  Acute cystitis with hematuria    ED Discharge Orders    None       Arthor Captain, PA-C 03/11/18 1658      Cathren Laine, MD 03/13/18 1259

## 2018-05-25 ENCOUNTER — Encounter (HOSPITAL_COMMUNITY): Payer: Self-pay | Admitting: Emergency Medicine

## 2018-05-25 ENCOUNTER — Emergency Department (HOSPITAL_COMMUNITY)
Admission: EM | Admit: 2018-05-25 | Discharge: 2018-05-25 | Disposition: A | Discharge status: Home / Self Care | Payer: Self-pay | Attending: Emergency Medicine | Admitting: Emergency Medicine

## 2018-05-25 ENCOUNTER — Other Ambulatory Visit: Payer: Self-pay

## 2018-05-25 DIAGNOSIS — F17210 Nicotine dependence, cigarettes, uncomplicated: Secondary | ICD-10-CM | POA: Insufficient documentation

## 2018-05-25 DIAGNOSIS — N6320 Unspecified lump in the left breast, unspecified quadrant: Secondary | ICD-10-CM | POA: Diagnosis present

## 2018-05-25 DIAGNOSIS — Z79899 Other long term (current) drug therapy: Secondary | ICD-10-CM | POA: Insufficient documentation

## 2018-05-25 DIAGNOSIS — J45909 Unspecified asthma, uncomplicated: Secondary | ICD-10-CM | POA: Insufficient documentation

## 2018-05-25 HISTORY — DX: Bradycardia, unspecified: R00.1

## 2018-05-25 LAB — POC URINE PREG, ED: Preg Test, Ur: NEGATIVE

## 2018-05-25 NOTE — ED Notes (Signed)
Pt verbalized understanding of discharge instructions and denies any further questions at this time.   

## 2018-05-25 NOTE — Discharge Planning (Addendum)
Lynett Brasil J. Lucretia Roers, RN, BSN, [place] [phone]  [facility] set up appointment with Sindy Messing, PA-C at [facility] on 06/17/18 @ 2:30.  Spoke with pt at bedside and advised to please arrive 15 min early and take a picture ID and your current medications.  Pt verbalizes understanding of keeping appointment. Marland Kitchen

## 2018-05-25 NOTE — Discharge Instructions (Signed)
Case management has been able to get you a follow-up appointment on November 6 at 2:30 PM.  Please go to this appointment as he will be able to establish care, and get set up for mammograms or other testing is needed.

## 2018-05-25 NOTE — ED Notes (Signed)
ED Provider at bedside for breast examination. Chaperone present.

## 2018-05-25 NOTE — ED Provider Notes (Signed)
[facility] EMERGENCY DEPARTMENT Provider Note   CSN: [id number] Arrival date & time: 05/25/18  0847     History   Chief Complaint No chief complaint on file.   HPI Yolanda Chung is a 37 y.o. female with a past medical history of asthma, who presents today for evaluation of a left-sided breast mass.  She reports that she first noticed it about 3 weeks ago.  She denies any skin changes, no discharge from the nipple.  She denies any fevers.  She states that she is currently menstruating.  She states that it is not painful, however she is aware of it as it is uncomfortable feeling.  She denies any personal history of breast cancer or strong family history of to the best of her knowledge.  HPI  Past Medical History:  Diagnosis Date  . Asthma   . Bradycardia     Patient Active Problem List   Diagnosis Date Noted  . Left breast lump 05/25/2018    No past surgical history on file.   OB History   None      Home Medications    Prior to Admission medications   Medication Sig Start Date End Date Taking? Authorizing Provider  albuterol (PROVENTIL HFA;VENTOLIN HFA) 108 (90 Base) MCG/ACT inhaler Inhale 2 puffs into the lungs every 4 (four) hours as needed for wheezing or shortness of breath. 07/03/17   Yolanda Chung, Yolanda C, PA-Chung  azithromycin (ZITHROMAX) 250 MG tablet Take 1 tablet (250 mg total) by mouth daily. Take first 2 tablets together, then 1 every day until finished. 07/03/17   Yolanda Chung, Yolanda C, PA-Chung  cephALEXin (KEFLEX) 500 MG capsule 2 caps po bid x 7 days 03/11/18   Yolanda Captain, PA-Chung  cyclobenzaprine (FLEXERIL) 5 MG tablet Take 1 tablet (5 mg total) by mouth 2 (two) times daily as needed for muscle spasms. 03/11/18   Yolanda Captain, PA-Chung  HYDROcodone-acetaminophen (NORCO/VICODIN) 5-325 MG tablet Take 2 tablets by mouth every 4 (four) hours as needed. 07/18/15   Yolanda Chung, Yolanda Gins, PA-Chung  ibuprofen (ADVIL,MOTRIN) 200 MG tablet Take 400 mg by mouth every 6 (six) hours as  needed for mild pain.    [provider]  ibuprofen (ADVIL,MOTRIN) 400 MG tablet Take 1 tablet (400 mg total) by mouth every 6 (six) hours as needed for fever, mild pain or moderate pain. 07/03/17   Yolanda Chung, Yolanda C, PA-Chung  meloxicam (MOBIC) 7.5 MG tablet Take 1 tablet (7.5 mg total) by mouth 2 (two) times daily. 03/11/18   Yolanda Captain, PA-Chung  methocarbamol (ROBAXIN) 500 MG tablet Take 1 tablet (500 mg total) by mouth 2 (two) times daily as needed for muscle spasms. 07/18/15   Yolanda Chung, Yolanda Gins, PA-Chung  naproxen (NAPROSYN) 500 MG tablet Take 1 tablet (500 mg total) by mouth 2 (two) times daily. 07/18/15   Yolanda Chung, Yolanda Gins, PA-Chung  naproxen (NAPROSYN) 500 MG tablet Take 1 tablet (500 mg total) 2 (two) times daily as needed by mouth for mild pain, moderate pain or headache (TAKE WITH MEALS.). 06/21/17   Yolanda Chung, [place], PA-Chung    Family History Family History  Problem Relation Age of Onset  . Seizures Mother     Social History Social History   Tobacco Use  . Smoking status: Current Every Day Smoker  . Smokeless tobacco: Never Used  Substance Use Topics  . Alcohol use: No  . Drug use: No     Allergies   Patient has no known allergies.   Review of  Systems Review of Systems  Constitutional: Negative for chills, fever and unexpected weight change.  Neurological: Negative for weakness and light-headedness.  All other systems reviewed and are negative.    Physical Exam Updated Vital Signs BP 122/78 (BP Location: Left Arm)   Pulse (!) 55   Temp 98.2 F (36.8 Chung) (Oral)   Resp 16   LMP 05/25/2018   SpO2 100%   Physical Exam  Constitutional: She appears well-developed. No distress.  HENT:  Head: Normocephalic.  Pulmonary/Chest: Effort normal. No respiratory distress. Right breast exhibits no inverted nipple, no mass, no nipple discharge, no skin change and no tenderness. Left breast exhibits mass and tenderness. Left breast exhibits no inverted nipple, no nipple discharge and no skin  change.  Left breast has a 1 cm x 1 cm tender, firm, swelling at approximately 7:00 2 to 3 cm from the nipple.  No discharge from the breast, no skin changes.  Patient's primary RN in room as chaperone during exam.  Neurological: She is alert.  Skin: Skin is warm and dry. She is not diaphoretic.  Psychiatric: She has a normal mood and affect.  Nursing note and vitals reviewed.    ED Treatments / Results  Labs (all labs ordered are listed, but only abnormal results are displayed) Labs Reviewed  POC URINE PREG, ED    EKG None  Radiology No results found.  Procedures Procedures (including critical care time)  Medications Ordered in ED Medications - No data to display   Initial Impression / Assessment and Plan / ED Course  I have reviewed the triage vital signs and the nursing notes.  Pertinent labs & imaging results that were available during my care of the patient were reviewed by me and considered in my medical decision making (see chart for details).  Clinical Course as of May 25 1728  Mon May 25, 2018  1024 Case management is not in office, speak with social worker who will give message to case management.  Disposition pending case management involvement at this time.   [EH]    Clinical Course User Index [EH] Yolanda Gong, PA-Chung   Patient presents today for evaluation of a lump in her left breast that she noticed 3 weeks ago.  Exam shows a 1 cm lump in the left breast.  Patient does not have a primary care doctor.  Case management was consulted who was able to set patient up with primary care doctor for further evaluation, including mammogram, and referral to breast center as needed.  Masses not consistent with an abscess at this time.  Return precautions were discussed with patient who states their understanding.  At the time of discharge patient denied any unaddressed complaints or concerns.  Patient is agreeable for discharge home.   Final Clinical  Impressions(s) / ED Diagnoses   Final diagnoses:  Left breast lump    ED Discharge Orders    None       Yolanda Chung, Cordelia Poche 05/25/18 1730    Yolanda Loveless, MD 05/26/18 [phone]

## 2018-05-25 NOTE — ED Notes (Signed)
Nurse Case Manager at beside. Information provided to patient for primary care.

## 2018-05-25 NOTE — ED Triage Notes (Signed)
Pt states she has a lump in her left breast for 3 weeks. Non abscess like lump noted.

## 2018-05-25 NOTE — Progress Notes (Signed)
CSW spoke with PA who informed CSW that pt is needing to be established with a breast center. CSW has left voicemail for [facility] to assist with further needs of getting pt set up with outpatient resources at this time. There are no further CSW needs at this time. CSW will sign off.    Claude Manges. Belle Charlie, MSW, LCSW-A Emergency Department Clinical Social Worker [phone]

## 2018-06-10 ENCOUNTER — Encounter (HOSPITAL_COMMUNITY): Payer: Self-pay | Admitting: *Deleted

## 2018-06-10 ENCOUNTER — Emergency Department (HOSPITAL_COMMUNITY)
Admission: EM | Admit: 2018-06-10 | Discharge: 2018-06-10 | Disposition: A | Discharge status: Home / Self Care | Payer: Self-pay | Attending: Emergency Medicine | Admitting: Emergency Medicine

## 2018-06-10 ENCOUNTER — Emergency Department (HOSPITAL_COMMUNITY): Payer: Self-pay

## 2018-06-10 DIAGNOSIS — Y939 Activity, unspecified: Secondary | ICD-10-CM | POA: Insufficient documentation

## 2018-06-10 DIAGNOSIS — F17200 Nicotine dependence, unspecified, uncomplicated: Secondary | ICD-10-CM | POA: Insufficient documentation

## 2018-06-10 DIAGNOSIS — J45909 Unspecified asthma, uncomplicated: Secondary | ICD-10-CM | POA: Insufficient documentation

## 2018-06-10 DIAGNOSIS — S99922A Unspecified injury of left foot, initial encounter: Secondary | ICD-10-CM | POA: Insufficient documentation

## 2018-06-10 DIAGNOSIS — Z79899 Other long term (current) drug therapy: Secondary | ICD-10-CM | POA: Insufficient documentation

## 2018-06-10 DIAGNOSIS — W208XXA Other cause of strike by thrown, projected or falling object, initial encounter: Secondary | ICD-10-CM | POA: Insufficient documentation

## 2018-06-10 DIAGNOSIS — Y929 Unspecified place or not applicable: Secondary | ICD-10-CM | POA: Insufficient documentation

## 2018-06-10 DIAGNOSIS — Y999 Unspecified external cause status: Secondary | ICD-10-CM | POA: Insufficient documentation

## 2018-06-10 MED ORDER — ACETAMINOPHEN 500 MG PO TABS
1000.0000 mg | ORAL_TABLET | Freq: Once | ORAL | Status: AC
  Administered 2018-06-10: 1000 mg via ORAL
  Filled 2018-06-10: qty 2

## 2018-06-10 NOTE — ED Notes (Signed)
Patient transported to X-ray 

## 2018-06-10 NOTE — Discharge Instructions (Addendum)
As discussed, today's evaluation has been generally reassuring. There is not current evidence for a fracture or broken toe. However, he likely sustained a substantial contusion, or bruising of the toe and joint. Please use the provided rigid shoe for the next week, use ibuprofen, 400 mg daily, and ice as needed for additional pain control. Return here, or follow-up with our orthopedic colleagues for any concerning changes in your condition.

## 2018-06-10 NOTE — ED Provider Notes (Signed)
[facility] EMERGENCY DEPARTMENT Provider Note   CSN: [id number] Arrival date & time: 06/10/18  1217     History   Chief Complaint Chief Complaint  Patient presents with  . Toe Injury    HPI Yolanda Chung is a 37 y.o. female.  HPI Patient presents with toe pain purulent onset was 2 days ago, when she dropped a heavy frozen water bottle onto it.  Since that time she has had pain, swelling, discoloration about the fourth left distal toe. Pain is not improved with OTC medication She did not suffer any other injuries, has had other complaints.  Past Medical History:  Diagnosis Date  . Asthma   . Bradycardia     Patient Active Problem List   Diagnosis Date Noted  . Left breast lump 05/25/2018    History reviewed. No pertinent surgical history.   OB History   None      Home Medications    Prior to Admission medications   Medication Sig Start Date End Date Taking? Authorizing Provider  albuterol (PROVENTIL HFA;VENTOLIN HFA) 108 (90 Base) MCG/ACT inhaler Inhale 2 puffs into the lungs every 4 (four) hours as needed for wheezing or shortness of breath. 07/03/17   Joy, Shawn C, PA-C  azithromycin (ZITHROMAX) 250 MG tablet Take 1 tablet (250 mg total) by mouth daily. Take first 2 tablets together, then 1 every day until finished. 07/03/17   Joy, Shawn C, PA-C  cephALEXin (KEFLEX) 500 MG capsule 2 caps po bid x 7 days 03/11/18   Arthor Captain, PA-C  cyclobenzaprine (FLEXERIL) 5 MG tablet Take 1 tablet (5 mg total) by mouth 2 (two) times daily as needed for muscle spasms. 03/11/18   Arthor Captain, PA-C  HYDROcodone-acetaminophen (NORCO/VICODIN) 5-325 MG tablet Take 2 tablets by mouth every 4 (four) hours as needed. 07/18/15   Sam, Ace Gins, PA-C  ibuprofen (ADVIL,MOTRIN) 200 MG tablet Take 400 mg by mouth every 6 (six) hours as needed for mild pain.    [provider]  ibuprofen (ADVIL,MOTRIN) 400 MG tablet Take 1 tablet (400 mg total) by mouth every  6 (six) hours as needed for fever, mild pain or moderate pain. 07/03/17   Joy, Shawn C, PA-C  meloxicam (MOBIC) 7.5 MG tablet Take 1 tablet (7.5 mg total) by mouth 2 (two) times daily. 03/11/18   Arthor Captain, PA-C  methocarbamol (ROBAXIN) 500 MG tablet Take 1 tablet (500 mg total) by mouth 2 (two) times daily as needed for muscle spasms. 07/18/15   Sam, Ace Gins, PA-C  naproxen (NAPROSYN) 500 MG tablet Take 1 tablet (500 mg total) by mouth 2 (two) times daily. 07/18/15   Sam, Ace Gins, PA-C  naproxen (NAPROSYN) 500 MG tablet Take 1 tablet (500 mg total) 2 (two) times daily as needed by mouth for mild pain, moderate pain or headache (TAKE WITH MEALS.). 06/21/17   Street, Lincoln, PA-C    Family History Family History  Problem Relation Age of Onset  . Seizures Mother     Social History Social History   Tobacco Use  . Smoking status: Current Every Day Smoker  . Smokeless tobacco: Never Used  Substance Use Topics  . Alcohol use: No  . Drug use: No     Allergies   Patient has no known allergies.   Review of Systems Review of Systems  Constitutional: Negative for fever.  Respiratory: Negative for shortness of breath.   Cardiovascular: Negative for chest pain.  Musculoskeletal:  Negative aside from HPI  Skin:       Negative aside from HPI  Allergic/Immunologic: Negative for immunocompromised state.  Neurological: Negative for weakness.     Physical Exam Updated Vital Signs BP 103/61 (BP Location: Right Arm)   Pulse 65   Temp 98.4 F (36.9 C) (Oral)   Resp 16   LMP 05/25/2018   SpO2 100%   Physical Exam  Constitutional: She is oriented to person, place, and time. She appears well-developed and well-nourished. No distress.  HENT:  Head: Normocephalic and atraumatic.  Eyes: Conjunctivae and EOM are normal.  Cardiovascular: Normal rate, regular rhythm and intact distal pulses.  Abdominal: She exhibits no distension.  Musculoskeletal: She exhibits no edema.        Feet:  Neurological: She is alert and oriented to person, place, and time. No cranial nerve deficit.  Skin: Skin is warm and dry.  Psychiatric: She has a normal mood and affect.  Nursing note and vitals reviewed.    ED Treatments / Results   Radiology Dg Toe 4th Left  Result Date: 06/10/2018 CLINICAL DATA:  Injury left foot. EXAM: LEFT FOURTH TOE COMPARISON:  No recent prior. FINDINGS: No acute bony or joint abnormality identified. No evidence of fracture or dislocation. IMPRESSION: No acute abnormality. Electronically Signed   By: Maisie Fus  Register   On: 06/10/2018 12:59    Procedures Procedures (including critical care time)  Medications Ordered in ED Medications  acetaminophen (TYLENOL) tablet 1,000 mg (has no administration in time range)     Initial Impression / Assessment and Plan / ED Course  I have reviewed the triage vital signs and the nursing notes.  Pertinent labs & imaging results that were available during my care of the patient were reviewed by me and considered in my medical decision making (see chart for details).  Previously well female presents after sustaining trauma to her left fourth toe. No evidence for fracture, though occult fracture remains a possibility, and the patient will follow-up with orthopedics. Patient placed in postoperative immobilization device, discharged in stable condition.  Final Clinical Impressions(s) / ED Diagnoses   Final diagnoses:  Injury of toe on left foot, initial encounter     Gerhard Munch, MD 06/10/18 1338

## 2018-06-10 NOTE — ED Triage Notes (Signed)
Pt in stating she had a large frozen bottle fall onto her left foot two days ago, pain has continued and worse when walking, no distress noted

## 2018-06-17 ENCOUNTER — Inpatient Hospital Stay (INDEPENDENT_AMBULATORY_CARE_PROVIDER_SITE_OTHER): Payer: Self-pay | Admitting: Physician Assistant

## 2018-08-22 ENCOUNTER — Emergency Department (HOSPITAL_COMMUNITY)
Admission: EM | Admit: 2018-08-22 | Discharge: 2018-08-22 | Disposition: A | Discharge status: Home / Self Care | Payer: Self-pay | Attending: Emergency Medicine | Admitting: Emergency Medicine

## 2018-08-22 ENCOUNTER — Encounter (HOSPITAL_COMMUNITY): Payer: Self-pay

## 2018-08-22 DIAGNOSIS — J45909 Unspecified asthma, uncomplicated: Secondary | ICD-10-CM | POA: Insufficient documentation

## 2018-08-22 DIAGNOSIS — F17210 Nicotine dependence, cigarettes, uncomplicated: Secondary | ICD-10-CM | POA: Insufficient documentation

## 2018-08-22 DIAGNOSIS — K0889 Other specified disorders of teeth and supporting structures: Secondary | ICD-10-CM | POA: Insufficient documentation

## 2018-08-22 DIAGNOSIS — Z79899 Other long term (current) drug therapy: Secondary | ICD-10-CM | POA: Insufficient documentation

## 2018-08-22 MED ORDER — AMOXICILLIN-POT CLAVULANATE 875-125 MG PO TABS: 1.0000 | ORAL_TABLET | Freq: Two times a day (BID) | ORAL | 0 refills | Status: DC

## 2018-08-22 MED ORDER — NAPROXEN 500 MG PO TABS: 500.0000 mg | ORAL_TABLET | Freq: Two times a day (BID) | ORAL | 0 refills | Status: DC

## 2018-08-22 NOTE — ED Triage Notes (Signed)
Onset 2 days right sided dental pain.

## 2018-08-22 NOTE — Discharge Instructions (Signed)
Return to ED for worsening symptoms, increased swelling, drainage, trouble breathing, trouble swallowing. Complete the entire course of antibiotics regardless of symptom improvement to prevent worsening or recurrence of your infection.

## 2018-08-22 NOTE — ED Notes (Signed)
Patient verbalizes understanding of discharge instructions. Opportunity for questioning and answers were provided. Armband removed by staff, pt discharged from ED ambulatory.   

## 2018-08-22 NOTE — ED Provider Notes (Signed)
[facility] EMERGENCY DEPARTMENT Provider Note   CSN: [id number] Arrival date & time: 08/22/18  1536     History   Chief Complaint Chief Complaint  Patient presents with  . Dental Pain    HPI Yolanda Chung is a 38 y.o. female who presents to ED for 2-day history of right upper dental pain.  Describes the pain as sharp, worse with eating.  She last saw dentist 1 year ago.  She has been taking ibuprofen with some improvement in her symptoms.  She cannot recall any inciting event that may have triggered the pain.  She denies any trouble breathing, trouble swallowing, neck pain, fevers, drainage from the area.  HPI  Past Medical History:  Diagnosis Date  . Asthma   . Bradycardia     Patient Active Problem List   Diagnosis Date Noted  . Left breast lump 05/25/2018    History reviewed. No pertinent surgical history.   OB History   No obstetric history on file.      Home Medications    Prior to Admission medications   Medication Sig Start Date End Date Taking? Authorizing Provider  albuterol (PROVENTIL HFA;VENTOLIN HFA) 108 (90 Base) MCG/ACT inhaler Inhale 2 puffs into the lungs every 4 (four) hours as needed for wheezing or shortness of breath. 07/03/17   Joy, Shawn C, PA-C  amoxicillin-clavulanate (AUGMENTIN) 875-125 MG tablet Take 1 tablet by mouth every 12 (twelve) hours. 08/22/18   Rhylie Stehr, PA-C  azithromycin (ZITHROMAX) 250 MG tablet Take 1 tablet (250 mg total) by mouth daily. Take first 2 tablets together, then 1 every day until finished. 07/03/17   Joy, Shawn C, PA-C  cephALEXin (KEFLEX) 500 MG capsule 2 caps po bid x 7 days 03/11/18   Arthor Captain, PA-C  cyclobenzaprine (FLEXERIL) 5 MG tablet Take 1 tablet (5 mg total) by mouth 2 (two) times daily as needed for muscle spasms. 03/11/18   Arthor Captain, PA-C  HYDROcodone-acetaminophen (NORCO/VICODIN) 5-325 MG tablet Take 2 tablets by mouth every 4 (four) hours as needed. 07/18/15   Sam, Ace Gins, PA-C  ibuprofen (ADVIL,MOTRIN) 200 MG tablet Take 400 mg by mouth every 6 (six) hours as needed for mild pain.    [provider]  ibuprofen (ADVIL,MOTRIN) 400 MG tablet Take 1 tablet (400 mg total) by mouth every 6 (six) hours as needed for fever, mild pain or moderate pain. 07/03/17   Joy, Shawn C, PA-C  meloxicam (MOBIC) 7.5 MG tablet Take 1 tablet (7.5 mg total) by mouth 2 (two) times daily. 03/11/18   Arthor Captain, PA-C  methocarbamol (ROBAXIN) 500 MG tablet Take 1 tablet (500 mg total) by mouth 2 (two) times daily as needed for muscle spasms. 07/18/15   Sam, Ace Gins, PA-C  naproxen (NAPROSYN) 500 MG tablet Take 1 tablet (500 mg total) by mouth 2 (two) times daily. 08/22/18   Dietrich Pates, PA-C    Family History Family History  Problem Relation Age of Onset  . Seizures Mother     Social History Social History   Tobacco Use  . Smoking status: Current Every Day Smoker  . Smokeless tobacco: Never Used  Substance Use Topics  . Alcohol use: No  . Drug use: No     Allergies   Patient has no known allergies.   Review of Systems Review of Systems  Constitutional: Negative for chills and fever.  HENT: Positive for dental problem.   Respiratory: Negative for shortness of breath.   Musculoskeletal: Negative  for neck pain.     Physical Exam Updated Vital Signs BP 127/80   Pulse (!) 57   Temp 98.5 F (36.9 C) (Oral)   Resp 16   SpO2 99%   Physical Exam Vitals signs and nursing note reviewed.  Constitutional:      General: She is not in acute distress.    Appearance: She is well-developed. She is not diaphoretic.  HENT:     Head: Normocephalic and atraumatic.     Mouth/Throat:     Dentition: Abnormal dentition. Does not have dentures. Dental caries present. No dental tenderness, gingival swelling, dental abscesses or gum lesions.      Comments: Overall poor dentition with several missing, chipped and decaying teeth noted.  Tenderness to palpation over the  indicated gumline with no gross dental abscess or site of drainage. No facial, neck or cheek swelling noted. No pooling of secretions or trismus.  Normal voice noted with no difficulty swallowing or breathing.  No submandibular erythema, edema or crepitus noted. Eyes:     General: No scleral icterus.    Conjunctiva/sclera: Conjunctivae normal.  Neck:     Musculoskeletal: Normal range of motion.  Pulmonary:     Effort: Pulmonary effort is normal. No respiratory distress.  Skin:    Findings: No rash.  Neurological:     Mental Status: She is alert.      ED Treatments / Results  Labs (all labs ordered are listed, but only abnormal results are displayed) Labs Reviewed - No data to display  EKG None  Radiology No results found.  Procedures Procedures (including critical care time)  Medications Ordered in ED Medications - No data to display   Initial Impression / Assessment and Plan / ED Course  I have reviewed the triage vital signs and the nursing notes.  Pertinent labs & imaging results that were available during my care of the patient were reviewed by me and considered in my medical decision making (see chart for details).     Patient with dentalgia. On exam, there is no evidence of a drainable abscess. No trismus, glossal elevation, unilateral tonsillar swelling. No evidence of retropharyngeal or peritonsillar abscess or Ludwig angina. Will treat with  Augmentin and naproxen. Pt instructed to follow-up with dentist as soon as possible. Resource guide provided with AVS.  Patient is hemodynamically stable, in NAD, and able to ambulate in the ED. Evaluation does not show pathology that would require ongoing emergent intervention or inpatient treatment. I explained the diagnosis to the patient. Pain has been managed and has no complaints prior to discharge. Patient is comfortable with above plan and is stable for discharge at this time. All questions were answered prior to  disposition. Strict return precautions for returning to the ED were discussed. Encouraged follow up with PCP.    Portions of this note were generated with Scientist, clinical (histocompatibility and immunogenetics). Dictation errors may occur despite best attempts at proofreading.  Final Clinical Impressions(s) / ED Diagnoses   Final diagnoses:  Pain, dental    ED Discharge Orders         Ordered    amoxicillin-clavulanate (AUGMENTIN) 875-125 MG tablet  Every 12 hours     08/22/18 1613    naproxen (NAPROSYN) 500 MG tablet  2 times daily     08/22/18 1613           Dietrich Pates, PA-C 08/22/18 1616    Derwood Kaplan, MD 08/23/18 1117

## 2020-11-20 ENCOUNTER — Emergency Department (HOSPITAL_COMMUNITY)
Admission: EM | Admit: 2020-11-20 | Discharge: 2020-11-20 | Disposition: A | Discharge status: Home / Self Care | Payer: Self-pay | Attending: Emergency Medicine | Admitting: Emergency Medicine

## 2020-11-20 ENCOUNTER — Other Ambulatory Visit: Payer: Self-pay

## 2020-11-20 ENCOUNTER — Emergency Department (HOSPITAL_COMMUNITY): Payer: Self-pay

## 2020-11-20 ENCOUNTER — Encounter (HOSPITAL_COMMUNITY): Payer: Self-pay | Admitting: Emergency Medicine

## 2020-11-20 DIAGNOSIS — F17200 Nicotine dependence, unspecified, uncomplicated: Secondary | ICD-10-CM | POA: Insufficient documentation

## 2020-11-20 DIAGNOSIS — K122 Cellulitis and abscess of mouth: Secondary | ICD-10-CM | POA: Insufficient documentation

## 2020-11-20 DIAGNOSIS — J45909 Unspecified asthma, uncomplicated: Secondary | ICD-10-CM | POA: Insufficient documentation

## 2020-11-20 LAB — BASIC METABOLIC PANEL
Anion gap: 11 (ref 5–15)
BUN: 9 mg/dL (ref 6–20)
CO2: 20 mmol/L — ABNORMAL LOW (ref 22–32)
Calcium: 9.4 mg/dL (ref 8.9–10.3)
Chloride: 107 mmol/L (ref 98–111)
Creatinine, Ser: 0.61 mg/dL (ref 0.44–1.00)
GFR, Estimated: >60 mL/min (ref >60)
Glucose, Bld: 87 mg/dL (ref 70–99)
Potassium: 3.6 mmol/L (ref 3.5–5.1)
Sodium: 138 mmol/L (ref 135–145)

## 2020-11-20 LAB — I-STAT BETA HCG BLOOD, ED (MC, WL, AP ONLY): I-stat hCG, quantitative: <5 m[IU]/mL (ref <5)

## 2020-11-20 MED ORDER — IOHEXOL 300 MG/ML  SOLN
75.0000 mL | Freq: Once | INTRAMUSCULAR | Status: AC | PRN
  Administered 2020-11-20: 75 mL via INTRAVENOUS

## 2020-11-20 MED ORDER — AMOXICILLIN-POT CLAVULANATE 875-125 MG PO TABS: 1.0000 | ORAL_TABLET | Freq: Two times a day (BID) | ORAL | 0 refills | Status: DC

## 2020-11-20 MED ORDER — ACETAMINOPHEN 325 MG PO TABS
650.0000 mg | ORAL_TABLET | Freq: Once | ORAL | Status: AC
  Administered 2020-11-20: 650 mg via ORAL
  Filled 2020-11-20: qty 2

## 2020-11-20 MED ORDER — LIDOCAINE-EPINEPHRINE (PF) 2 %-1:200000 IJ SOLN
10.0000 mL | Freq: Once | INTRAMUSCULAR | Status: AC
  Administered 2020-11-20: 10 mL
  Filled 2020-11-20: qty 20

## 2020-11-20 MED ORDER — FENTANYL CITRATE (PF) 100 MCG/2ML IJ SOLN
50.0000 ug | Freq: Once | INTRAMUSCULAR | Status: AC
  Administered 2020-11-20: 50 ug via INTRAVENOUS
  Filled 2020-11-20: qty 2

## 2020-11-20 NOTE — Discharge Instructions (Signed)
Please follow-up with a dentist ASAP as we discussed.  I gave you a resource guide for dentist around the area.  Please take the antibiotics today, you can take Tylenol directed on the bottle for pain.  If you have any new or worsening concerning symptom please back to the emergency department.  Please speak to pharmacist about any new medications prescribed in regards to side effects or interactions with other medications.  Get help right away if: You have a fever or chills. Your symptoms suddenly get worse. You have a very bad headache. You have problems breathing or swallowing. You have trouble opening your mouth. You have swelling in your neck or around your eye.

## 2020-11-20 NOTE — ED Triage Notes (Signed)
Patient reports pain at roof of her mouth onset 2 days ago , denies injury .

## 2020-11-20 NOTE — ED Provider Notes (Signed)
[facility] EMERGENCY DEPARTMENT Provider Note   CSN: [id number] Arrival date & time: 11/20/20  0814     History Chief Complaint  Patient presents with  . Mouth Pain     Yolanda Chung is a 40 y.o. female with pertinent past medical history of asthma and bradycardia that presents to the emergency department today for mouth pain.  Patient states that she has had this growth in her mouth for the past 3 days, has been getting bigger.  States that it is very painful.  States that her entire mouth is painful, therefore she went to the dentist, has have multiple teeth removed, but surgeries in 6 months.  She states that she went to the dentist 3 days ago, they saw the growth in her mouth and they told her her that needs to be cleared before they can do the procedure.  She states that the dentist told her to come to the emergency department.  Patient denies any specific injury, however states that she does burn her mouth frequently.  Denies any fevers, chills.  States that she is very nauseous due to the pain.  No vomiting.  No pain elsewhere.  No headache or ear pain.  Denies any recent antibiotics.  No other complaints at this time.  States that she is taking Tylenol without relief. HPI     Past Medical History:  Diagnosis Date  . Asthma   . Bradycardia     Patient Active Problem List   Diagnosis Date Noted  . Left breast lump 05/25/2018    History reviewed. No pertinent surgical history.   OB History   No obstetric history on file.     Family History  Problem Relation Age of Onset  . Seizures Mother     Social History   Tobacco Use  . Smoking status: Current Every Day Smoker  . Smokeless tobacco: Never Used  Substance Use Topics  . Alcohol use: No  . Drug use: No    Home Medications Prior to Admission medications   Medication Sig Start Date End Date Taking? Authorizing Provider  acetaminophen (TYLENOL) 325 MG tablet Take by mouth every 6 (six) hours  as needed for moderate pain or headache.   Yes [provider]  amoxicillin-clavulanate (AUGMENTIN) 875-125 MG tablet Take 1 tablet by mouth every 12 (twelve) hours. 11/20/20  Yes Farrel Gordon, PA-C    Allergies    Patient has no known allergies.  Review of Systems   Review of Systems  Constitutional: Negative for diaphoresis, fatigue and fever.  HENT: Positive for mouth sores. Negative for drooling, ear discharge, ear pain, postnasal drip, rhinorrhea, sinus pressure, sneezing, sore throat, tinnitus, trouble swallowing and voice change.   Eyes: Negative for pain and visual disturbance.  Respiratory: Negative for chest tightness and shortness of breath.   Cardiovascular: Negative for chest pain.  Gastrointestinal: Negative for blood in stool, nausea and vomiting.  Musculoskeletal: Negative for back pain and myalgias.  Skin: Negative for color change, pallor, rash and wound.  Neurological: Negative for syncope, weakness, light-headedness, numbness and headaches.  Psychiatric/Behavioral: Negative for behavioral problems and confusion.    Physical Exam Updated Vital Signs BP 119/69   Pulse 61   Temp 98.2 F (36.8 C) (Oral)   Resp 18   Ht 4\' 11"  (1.499 m)   Wt 40 kg   LMP 11/13/2020   SpO2 100%   BMI 17.81 kg/m   Physical Exam Constitutional:      General: She  is not in acute distress.    Appearance: Normal appearance. She is not ill-appearing, toxic-appearing or diaphoretic.  HENT:     Head: Normocephalic and atraumatic.     Mouth/Throat:     Mouth: Mucous membranes are moist.     Dentition: Abnormal dentition.     Tongue: No lesions.     Palate: Mass present. No lesions.     Pharynx: Oropharynx is clear. Uvula midline. No pharyngeal swelling, oropharyngeal exudate, posterior oropharyngeal erythema or uvula swelling.     Tonsils: No tonsillar exudate or tonsillar abscesses. 1+ on the right. 1+ on the left.      Comments: Patient with quarter sized cyst like mass  on the roof of her mouth.  Does not communicate with gums, however two front teeth are decaying to the point of gum line.  Area is the same color as her palate, no erythema.  Is not draining.  Area is extremely tender to palpation.  Does not extend past the second half of roof of mouth.  Patient has multiple dental caries.  Pharynx is clear.  No other lesions noted.  No dental abscesses noted, however gumline is tender to palpation everywhere in mouth. Eyes:     Extraocular Movements: Extraocular movements intact.     Pupils: Pupils are equal, round, and reactive to light.  Cardiovascular:     Rate and Rhythm: Normal rate and regular rhythm.     Pulses: Normal pulses.  Pulmonary:     Effort: Pulmonary effort is normal.     Breath sounds: Normal breath sounds.  Musculoskeletal:        General: Normal range of motion.  Skin:    General: Skin is warm and dry.     Capillary Refill: Capillary refill takes less than 2 seconds.  Neurological:     General: No focal deficit present.     Mental Status: She is alert and oriented to person, place, and time.  Psychiatric:        Mood and Affect: Mood normal.        Behavior: Behavior normal.        Thought Content: Thought content normal.     ED Results / Procedures / Treatments   Labs (all labs ordered are listed, but only abnormal results are displayed) Labs Reviewed  BASIC METABOLIC PANEL - Abnormal; Notable for the following components:      Result Value   CO2 20 (*)    All other components within normal limits  I-STAT BETA HCG BLOOD, ED ([facility], [facility], [facility] ONLY)    EKG None  Radiology CT Maxillofacial W Contrast  Result Date: 11/20/2020 CLINICAL DATA:  Pain along the roof of the mouth beginning 2 days ago. EXAM: CT MAXILLOFACIAL WITH CONTRAST TECHNIQUE: Multidetector CT imaging of the maxillofacial structures was performed with intravenous contrast. Multiplanar CT image reconstructions were also generated. CONTRAST:  42mL OMNIPAQUE IOHEXOL  300 MG/ML  SOLN COMPARISON:  None. FINDINGS: Osseous: No acute fracture. Multiple dental caries. Periapical erosion involving the bilateral central and right maxillary lateral incisors. Disruption of the lingual cortex of the maxilla associated with the lateral incisor erosion with a 1.5 x 1.2 x 1.5 cm intermediate attenuation mass or collection located immediately posterior to this along the hard palate without other associated osseous destruction. Orbits: Unremarkable. Sinuses: Mild-to-moderate circumferential mucosal thickening and small volume fluid in both maxillary sinuses. Mild right frontal sinus mucosal thickening. Clear mastoid air cells and tympanic cavities. Soft tissues: Unremarkable. Limited intracranial: Unremarkable.  IMPRESSION: 1. Dental caries with periapical erosion involving the maxillary incisors. 1.5 cm mass or collection along the anterior hard palate which could reflect an abscess arising from the right lateral incisor or tumor. Correlate with direct visualization. 2. Mucosal thickening and fluid in the maxillary sinuses, correlate for acute sinusitis. Electronically Signed   By: Sebastian AcheAllen  Grady M.D.   On: 11/20/2020 09:20    Procedures .Marland Kitchen.Incision and Drainage  Date/Time: 11/20/2020 10:10 AM Performed by: Farrel GordonPatel, Kahlea Cobert, PA-C Authorized by: Farrel GordonPatel, Cruze Zingaro, PA-C   Consent:    Consent obtained:  Verbal   Consent given by:  Patient   Risks discussed:  Bleeding, incomplete drainage, pain and damage to other organs   Alternatives discussed:  No treatment Universal protocol:    Procedure explained and questions answered to patient or proxy's satisfaction: yes     Relevant documents present and verified: yes     Test results available : yes     Imaging studies available: yes     Required blood products, implants, devices, and special equipment available: yes     Site/side marked: yes     Immediately prior to procedure, a time out was called: yes     Patient identity confirmed:   Verbally with patient Location:    Type:  Abscess   Size:  1.5 cm   Location:  Mouth   Mouth location:  Palate Sedation:    Sedation type:  None Anesthesia:    Anesthesia method:  Local infiltration and nerve block   Local anesthetic:  Lidocaine 1% WITH epi   Block needle gauge:  24 G   Block anesthetic:  Lidocaine 1% WITH epi   Block technique:  Nasopalantine block    Block injection procedure:  Anatomic landmarks identified, introduced needle, incremental injection, negative aspiration for blood and anatomic landmarks palpated   Block outcome:  Anesthesia achieved Procedure type:    Complexity:  Complex Procedure details:    Incision types:  Single straight   Incision depth:  Subcutaneous   Wound management:  Probed and deloculated, irrigated with saline and extensive cleaning   Drainage:  Purulent   Drainage amount:  Copious   Packing materials:  None Post-procedure details:    Procedure completion:  Tolerated well, no immediate complications     Medications Ordered in ED Medications  lidocaine-EPINEPHrine (XYLOCAINE W/EPI) 2 %-1:200000 (PF) injection 10 mL (has no administration in time range)  acetaminophen (TYLENOL) tablet 650 mg (650 mg Oral Given 11/20/20 0732)  iohexol (OMNIPAQUE) 300 MG/ML solution 75 mL (75 mLs Intravenous Contrast Given 11/20/20 0906)  fentaNYL (SUBLIMAZE) injection 50 mcg (50 mcg Intravenous Given 11/20/20 1021)    ED Course  I have reviewed the triage vital signs and the nursing notes.  Pertinent labs & imaging results that were available during my care of the patient were reviewed by me and considered in my medical decision making (see chart for details).    MDM Rules/Calculators/A&P                          Yolanda Chung is a 40 y.o. female with pertinent past medical history of asthma and bradycardia that presents to the emergency department today for mouth pain.  Differential to include cyst.  Torus palatinus is also on the differential,  however does not seem like a bony growth. Could be mucocele from burning her mouth or nasopatine duct cyst, however abscess also on the ddx due to the amount of  decay in mouth. Will obtain CT for better visualization.   CT scan shows abscess versus past, do favor abscess at this time since patient does have decay of that front incisor.  Shared decision making a patient with about draining this, risk-benefit discussed, patient wants to proceed with incision and drainage at this time.  Abscess successfully drained with copious amount of purulent discharge noted.  Patient's pain is controlled, patient will immediately see a dentist.  Will place on antibiotics, symptomatic treatment discussed.  Doubt need for further emergent work up at this time. I explained the diagnosis and have given explicit precautions to return to the ER including for any other new or worsening symptoms. The patient understands and accepts the medical plan as it's been dictated and I have answered their questions. Discharge instructions concerning home care and prescriptions have been given. The patient is STABLE and is discharged to home in good condition. I discussed this case with my attending physician who cosigned this note including patient's presenting symptoms, physical exam, and planned diagnostics and interventions. Attending physician stated agreement with plan or made changes to plan which were implemented.   Attending physician assessed patient at bedside.   Final Clinical Impression(s) / ED Diagnoses Final diagnoses:  Mouth abscess    Rx / DC Orders ED Discharge Orders         Ordered    amoxicillin-clavulanate (AUGMENTIN) 875-125 MG tablet  Every 12 hours        11/20/20 1115           Farrel Gordon, PA-C 11/20/20 1118    Little, Ambrose Finland, MD 11/21/20 [phone]

## 2021-08-10 ENCOUNTER — Encounter (HOSPITAL_COMMUNITY): Payer: Self-pay | Admitting: Emergency Medicine

## 2021-08-10 ENCOUNTER — Other Ambulatory Visit: Payer: Self-pay

## 2021-08-10 ENCOUNTER — Ambulatory Visit (HOSPITAL_COMMUNITY)
Admission: EM | Admit: 2021-08-10 | Discharge: 2021-08-10 | Disposition: A | Discharge status: Home / Self Care | Payer: Medicaid Other | Attending: Family Medicine | Admitting: Family Medicine

## 2021-08-10 DIAGNOSIS — N6314 Unspecified lump in the right breast, lower inner quadrant: Secondary | ICD-10-CM

## 2021-08-10 NOTE — Discharge Instructions (Addendum)
A mammogram and ultrasound has been ordered. Staff will fax the order today, but you can also call the phone number to get the imaging set up.

## 2021-08-10 NOTE — ED Notes (Signed)
Faxed over order to [facility].

## 2021-08-10 NOTE — ED Triage Notes (Signed)
Pt reports since new year noticed knot in right breast that is painful with palpitation.

## 2021-08-10 NOTE — ED Provider Notes (Signed)
[facility]    CSN: [id number] Arrival date & time: 08/10/21  1008      History   Chief Complaint Chief Complaint  Patient presents with   Breast Mass    HPI Yolanda Chung is a 41 y.o. female.   HPI Here for a knot noted in her medial lower right breast, in the last week or two. Is a little tender. Now f/c. No nipple dc.  LMP was 12/13.   Past Medical History:  Diagnosis Date   Asthma    Bradycardia     Patient Active Problem List   Diagnosis Date Noted   Left breast lump 05/25/2018    History reviewed. No pertinent surgical history.  OB History   No obstetric history on file.      Home Medications    Prior to Admission medications   Medication Sig Start Date End Date Taking? Authorizing Provider  acetaminophen (TYLENOL) 325 MG tablet Take by mouth every 6 (six) hours as needed for moderate pain or headache.    [provider]  amoxicillin-clavulanate (AUGMENTIN) 875-125 MG tablet Take 1 tablet by mouth every 12 (twelve) hours. 11/20/20   Farrel Gordon, PA-C    Family History Family History  Problem Relation Age of Onset   Seizures Mother     Social History Social History   Tobacco Use   Smoking status: Every Day   Smokeless tobacco: Never  Substance Use Topics   Alcohol use: No   Drug use: No     Allergies   Patient has no known allergies.   Review of Systems Review of Systems   Physical Exam Triage Vital Signs ED Triage Vitals  Enc Vitals Group     BP 08/10/21 1125 121/71     Pulse Rate 08/10/21 1125 62     Resp 08/10/21 1125 14     Temp 08/10/21 1125 98.3 F (36.8 C)     Temp Source 08/10/21 1125 Oral     SpO2 08/10/21 1125 98 %     Weight --      Height --      Head Circumference --      Peak Flow --      Pain Score 08/10/21 1124 6     Pain Loc --      Pain Edu? --      Excl. in GC? --    No data found.  Updated Vital Signs BP 121/71 (BP Location: Left Arm)    Pulse 62    Temp 98.3 F (36.8 C)  (Oral)    Resp 14    LMP 07/17/2021    SpO2 98%   Visual Acuity Right Eye Distance:   Left Eye Distance:   Bilateral Distance:    Right Eye Near:   Left Eye Near:    Bilateral Near:     Physical Exam Vitals reviewed.  Cardiovascular:     Rate and Rhythm: Normal rate and regular rhythm.     Heart sounds: No murmur heard. Pulmonary:     Effort: Pulmonary effort is normal.     Breath sounds: Normal breath sounds.  Chest:       Comments: Has a mobile, mildly tender firm nodule in her medial lower right breast, about 2 cm diamtere. No erythema or skin changes Neurological:     Mental Status: She is alert and oriented to person, place, and time.     UC Treatments / Results  Labs (all labs ordered are listed,  but only abnormal results are displayed) Labs Reviewed - No data to display  EKG   Radiology No results found.  Procedures Procedures (including critical care time)  Medications Ordered in UC Medications - No data to display  Initial Impression / Assessment and Plan / UC Course  I have reviewed the triage vital signs and the nursing notes.  Pertinent labs & imaging results that were available during my care of the patient were reviewed by me and considered in my medical decision making (see chart for details).     Will order dx mmg/US. Also will request assistance finding a pcp.  Final Clinical Impressions(s) / UC Diagnoses   Final diagnoses:  Mass of lower inner quadrant of right breast     Discharge Instructions      A mammogram and ultrasound has been ordered. Staff will fax the order today, but you can also call the phone number to get the imaging set up.     ED Prescriptions   None    PDMP not reviewed this encounter.   Zenia Resides, MD 08/10/21 1200

## 2021-08-16 ENCOUNTER — Other Ambulatory Visit: Payer: Self-pay | Admitting: Family Medicine

## 2021-08-16 DIAGNOSIS — N63 Unspecified lump in unspecified breast: Secondary | ICD-10-CM

## 2021-09-11 ENCOUNTER — Other Ambulatory Visit: Payer: Self-pay | Admitting: Family Medicine

## 2021-09-11 ENCOUNTER — Ambulatory Visit
Admission: RE | Admit: 2021-09-11 | Discharge: 2021-09-11 | Disposition: A | Discharge status: Home / Self Care | Payer: Medicaid Other | Source: Ambulatory Visit | Attending: Family Medicine | Admitting: Family Medicine

## 2021-09-11 ENCOUNTER — Other Ambulatory Visit: Payer: Self-pay

## 2021-09-11 DIAGNOSIS — N63 Unspecified lump in unspecified breast: Secondary | ICD-10-CM

## 2021-09-25 ENCOUNTER — Other Ambulatory Visit: Payer: Medicaid Other

## 2021-12-25 ENCOUNTER — Other Ambulatory Visit: Payer: Self-pay

## 2021-12-25 ENCOUNTER — Emergency Department (HOSPITAL_COMMUNITY)
Admission: EM | Admit: 2021-12-25 | Discharge: 2021-12-25 | Disposition: A | Discharge status: Home / Self Care | Payer: Self-pay | Attending: Emergency Medicine | Admitting: Emergency Medicine

## 2021-12-25 ENCOUNTER — Emergency Department (HOSPITAL_COMMUNITY): Payer: Self-pay

## 2021-12-25 DIAGNOSIS — J45909 Unspecified asthma, uncomplicated: Secondary | ICD-10-CM | POA: Insufficient documentation

## 2021-12-25 DIAGNOSIS — M5442 Lumbago with sciatica, left side: Secondary | ICD-10-CM | POA: Insufficient documentation

## 2021-12-25 DIAGNOSIS — Y9389 Activity, other specified: Secondary | ICD-10-CM | POA: Insufficient documentation

## 2021-12-25 DIAGNOSIS — F17200 Nicotine dependence, unspecified, uncomplicated: Secondary | ICD-10-CM | POA: Insufficient documentation

## 2021-12-25 DIAGNOSIS — X500XXA Overexertion from strenuous movement or load, initial encounter: Secondary | ICD-10-CM | POA: Insufficient documentation

## 2021-12-25 DIAGNOSIS — Y92512 Supermarket, store or market as the place of occurrence of the external cause: Secondary | ICD-10-CM | POA: Insufficient documentation

## 2021-12-25 MED ORDER — LIDOCAINE 5 % EX PTCH
2.0000 | MEDICATED_PATCH | CUTANEOUS | Status: DC
  Administered 2021-12-25: 2 via TRANSDERMAL
  Filled 2021-12-25: qty 2

## 2021-12-25 MED ORDER — IBUPROFEN 600 MG PO TABS: 600.0000 mg | ORAL_TABLET | Freq: Four times a day (QID) | ORAL | 0 refills | Status: DC | PRN

## 2021-12-25 MED ORDER — LIDOCAINE 5 % EX PTCH: 1.0000 | MEDICATED_PATCH | CUTANEOUS | 0 refills | Status: DC

## 2021-12-25 MED ORDER — KETOROLAC TROMETHAMINE 15 MG/ML IJ SOLN
15.0000 mg | Freq: Once | INTRAMUSCULAR | Status: AC
  Administered 2021-12-25: 15 mg via INTRAMUSCULAR
  Filled 2021-12-25: qty 1

## 2021-12-25 MED ORDER — PREDNISONE 10 MG PO TABS: 60.0000 mg | ORAL_TABLET | Freq: Every day | ORAL | 0 refills | Status: AC

## 2021-12-25 NOTE — ED Notes (Signed)
ED Provider at bedside. 

## 2021-12-25 NOTE — ED Triage Notes (Signed)
Pt. 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

## 2021-12-25 NOTE — Discharge Instructions (Addendum)
I will attach information for you to set up a primary care provider.  This may important for further management of your symptoms.  Also provided a work note through Friday to rest your back.  Continue to rest, ice, elevate.  Take the Profen as needed for breakthrough pain.  I will also prescribe a steroid pack as well as lidocaine patches for your sciatica symptoms.  Please do not hesitate to return to emergency department if the worrisome signs and symptoms we discussed become apparent.

## 2021-12-25 NOTE — ED Provider Notes (Signed)
[facility] EMERGENCY DEPARTMENT Provider Note   CSN: [id number] Arrival date & time: 12/25/21  1014     History {Add pertinent medical, surgical, social history, OB history to HPI:1} Chief Complaint  Patient presents with   Back Pain    Yolanda Chung is a 41 y.o. female.   Back Pain Associated symptoms: no fever    41 year old female presents emergency department with complaints of back pain.  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  She works at her [employer].  Patient states she continued to work despite pain and now pain is severe in nature.  She has tried a heating pad as well as Aleve a couple times a week with minimal to no relief.  Pain now is said to be radicular in nature on the left side radiating to just above the level of her left knee.  She denies weakness or sensory deficits in bilateral lower extremities.  She denies fever, IV drug use, recent steroid use, known cancer diagnosis, saddle anesthesia, bowel/bladder dysfunction.  She denies chest pain, shortness of breath, abdominal pain, nausea/vomiting/diarrhea, urinary/vaginal symptoms, change in bowel habits.  Past medical history significant for bradycardia and asthma  Home Medications Prior to Admission medications   Medication Sig Start Date End Date Taking? Authorizing Provider  acetaminophen (TYLENOL) 325 MG tablet Take by mouth every 6 (six) hours as needed for moderate pain or headache.    [provider]  amoxicillin-clavulanate (AUGMENTIN) 875-125 MG tablet Take 1 tablet by mouth every 12 (twelve) hours. 11/20/20   Farrel Gordon, PA-C      Allergies    Patient has no known allergies.    Review of Systems   Review of Systems  Constitutional:  Negative for chills and fever.  Musculoskeletal:  Positive for back pain.  All other systems reviewed and are negative.  Physical Exam Updated Vital Signs BP 133/79 (BP Location: Left Arm)    Pulse (!) 58   Temp 98.5 F (36.9 C) (Oral)   Resp 17   LMP 12/10/2021   SpO2 100%  Physical Exam  ED Results / Procedures / Treatments   Labs (all labs ordered are listed, but only abnormal results are displayed) Labs Reviewed - No data to display  EKG None  Radiology DG Lumbar Spine Complete  Result Date: 12/25/2021 CLINICAL DATA:  Lifting injury with low back pain. Symptoms over the last week. EXAM: LUMBAR SPINE - COMPLETE 4+ VIEW COMPARISON:  None FINDINGS: Normal alignment. Mild disc space narrowing at L3-4. Other disc heights are normal. No evidence of fracture. No facet arthropathy or pars defect. Sacroiliac joints appear normal. IMPRESSION: No acute or traumatic finding.  Mild disc space narrowing L3-4. Electronically Signed   By: Paulina Fusi M.D.   On: 12/25/2021 11:19    Procedures Procedures  {Document cardiac monitor, telemetry assessment procedure when appropriate:1}  Medications Ordered in ED Medications - No data to display  ED Course/ Medical Decision Making/ A&P                           Medical Decision Making  This patient presents to the ED for concern of ***, this involves an extensive number of treatment options, and is a complaint that carries with it a high risk of complications and morbidity.  The differential diagnosis includes ***   Co morbidities that complicate the patient evaluation  ***   Additional  history obtained:  Additional history obtained from *** External records from outside source obtained and reviewed including ***   Lab Tests:  I Ordered, and personally interpreted labs.  The pertinent results include:  ***   Imaging Studies ordered:  I ordered imaging studies including ***  I independently visualized and interpreted imaging which showed *** I agree with the radiologist interpretation   Cardiac Monitoring: / EKG:  The patient was maintained on a cardiac monitor.  I personally viewed and interpreted the cardiac  monitored which showed an underlying rhythm of: ***   Consultations Obtained:  I requested consultation with the ***,  and discussed lab and imaging findings as well as pertinent plan - they recommend: ***   Problem List / ED Course / Critical interventions / Medication management  *** I ordered medication including ***  for ***  Reevaluation of the patient after these medicines showed that the patient {resolved/improved/worsened:23923::"improved"} I have reviewed the patients home medicines and have made adjustments as needed   Social Determinants of Health:  ***   Test / Admission - Considered:  ***   {Document critical care time when appropriate:1} {Document review of labs and clinical decision tools ie heart score, Chads2Vasc2 etc:1}  {Document your independent review of radiology images, and any outside records:1} {Document your discussion with family members, caretakers, and with consultants:1} {Document social determinants of health affecting pt's care:1} {Document your decision making why or why not admission, treatments were needed:1} Final Clinical Impression(s) / ED Diagnoses Final diagnoses:  None    Rx / DC Orders ED Discharge Orders     None

## 2021-12-25 NOTE — ED Provider Triage Note (Signed)
Emergency Medicine Provider Triage Evaluation Note  Kaelyn Innocent , a 41 y.o. female  was evaluated in triage.  Pt complains of low back pain.  Reports that last Monday she was reaching to get something from the shelf at her job at [employer] and started to have severe back pain.  Worse on the right lower back.  Took Aleve for a few days and then stopped.  Says that it is not getting better with heat pads.  Review of Systems  Positive: Pain Negative: Saddle anesthesia, weakness, numbness, fevers, chills, bowel/bladder dysfunction  Physical Exam  BP 116/83 (BP Location: Right Arm)   Pulse (!) 55   Temp 98.5 F (36.9 C)   Resp 18   SpO2 100%  Gen:   Awake, no distress   Resp:  Normal effort  MSK:   Moves extremities without difficulty  Other:  Severe midline tenderness to the lumbar spine  Medical Decision Making  Medically screening exam initiated at 10:56 AM.  Appropriate orders placed.  Kanyon Bunn was informed that the remainder of the evaluation will be completed by another provider, this initial triage assessment does not replace that evaluation, and the importance of remaining in the ED until their evaluation is complete.     Saddie Benders, PA-C 12/25/21 1058

## 2022-01-16 ENCOUNTER — Emergency Department (HOSPITAL_COMMUNITY)
Admission: EM | Admit: 2022-01-16 | Discharge: 2022-01-16 | Disposition: A | Discharge status: Home / Self Care | Payer: Self-pay | Attending: Emergency Medicine | Admitting: Emergency Medicine

## 2022-01-16 ENCOUNTER — Encounter (HOSPITAL_COMMUNITY): Payer: Self-pay

## 2022-01-16 ENCOUNTER — Other Ambulatory Visit: Payer: Self-pay

## 2022-01-16 DIAGNOSIS — X500XXA Overexertion from strenuous movement or load, initial encounter: Secondary | ICD-10-CM | POA: Insufficient documentation

## 2022-01-16 DIAGNOSIS — M5442 Lumbago with sciatica, left side: Secondary | ICD-10-CM

## 2022-01-16 DIAGNOSIS — R001 Bradycardia, unspecified: Secondary | ICD-10-CM | POA: Insufficient documentation

## 2022-01-16 DIAGNOSIS — A599 Trichomoniasis, unspecified: Secondary | ICD-10-CM

## 2022-01-16 DIAGNOSIS — R350 Frequency of micturition: Secondary | ICD-10-CM | POA: Insufficient documentation

## 2022-01-16 DIAGNOSIS — J45909 Unspecified asthma, uncomplicated: Secondary | ICD-10-CM | POA: Insufficient documentation

## 2022-01-16 DIAGNOSIS — G8911 Acute pain due to trauma: Secondary | ICD-10-CM | POA: Insufficient documentation

## 2022-01-16 LAB — POC URINE PREG, ED: Preg Test, Ur: NEGATIVE

## 2022-01-16 LAB — URINALYSIS, ROUTINE W REFLEX MICROSCOPIC
Bilirubin Urine: NEGATIVE
Glucose, UA: NEGATIVE mg/dL
Ketones, ur: NEGATIVE mg/dL
Nitrite: NEGATIVE
Protein, ur: NEGATIVE mg/dL
Specific Gravity, Urine: 1.014 (ref 1.005–1.030)
pH: 5 (ref 5.0–8.0)

## 2022-01-16 MED ORDER — KETOROLAC TROMETHAMINE 60 MG/2ML IM SOLN
30.0000 mg | Freq: Once | INTRAMUSCULAR | Status: AC
  Administered 2022-01-16: 30 mg via INTRAMUSCULAR
  Filled 2022-01-16: qty 2

## 2022-01-16 MED ORDER — OXYCODONE HCL 5 MG PO TABS
5.0000 mg | ORAL_TABLET | Freq: Once | ORAL | Status: AC
  Administered 2022-01-16: 5 mg via ORAL
  Filled 2022-01-16: qty 1

## 2022-01-16 MED ORDER — CEFTRIAXONE SODIUM 500 MG IJ SOLR
500.0000 mg | Freq: Once | INTRAMUSCULAR | Status: AC
  Administered 2022-01-16: 500 mg via INTRAMUSCULAR
  Filled 2022-01-16: qty 500

## 2022-01-16 MED ORDER — METHYLPREDNISOLONE 4 MG PO TBPK: ORAL_TABLET | ORAL | 0 refills | Status: DC

## 2022-01-16 MED ORDER — CYCLOBENZAPRINE HCL 10 MG PO TABS
5.0000 mg | ORAL_TABLET | Freq: Once | ORAL | Status: AC
  Administered 2022-01-16: 5 mg via ORAL
  Filled 2022-01-16: qty 1

## 2022-01-16 MED ORDER — METRONIDAZOLE 500 MG PO TABS: 500.0000 mg | ORAL_TABLET | Freq: Two times a day (BID) | ORAL | 0 refills | Status: AC

## 2022-01-16 MED ORDER — DOXYCYCLINE HYCLATE 100 MG PO CAPS: 100.0000 mg | ORAL_CAPSULE | Freq: Two times a day (BID) | ORAL | 0 refills | Status: AC

## 2022-01-16 MED ORDER — OXYCODONE HCL 5 MG PO TABS: 5.0000 mg | ORAL_TABLET | ORAL | 0 refills | Status: AC | PRN

## 2022-01-16 MED ORDER — LIDOCAINE HCL (PF) 1 % IJ SOLN
INTRAMUSCULAR | Status: AC
  Administered 2022-01-16: 5 mL
  Filled 2022-01-16: qty 5

## 2022-01-16 MED ORDER — CYCLOBENZAPRINE HCL 10 MG PO TABS: 10.0000 mg | ORAL_TABLET | Freq: Two times a day (BID) | ORAL | 0 refills | Status: DC | PRN

## 2022-01-16 NOTE — ED Triage Notes (Signed)
Pt arrived POV from home c/o back pain x2 weeks. Pt states she was here 2 weeks ago and told she had a herniated disk and if the pain got worse to come back.

## 2022-01-16 NOTE — ED Provider Notes (Signed)
[facility]   CSN: [id number] Arrival date & time: 01/16/22  0843     History  Chief Complaint  Patient presents with   Back Pain    Yolanda RolesGloria Grisanti is a 41 y.o. female.  HPI     41yo female presents with history of asthma, bradycardia, ED visit 5/23 for back pain presents with concern for continued lower back pain.  Lifts boxes at work, like boxes of pineapples and was lifting one and felt pain in her lower back 2 weeks ago. Pain severe in lower back, worse on the left side, radiating down the left leg.  No numbness, weakness, loss of control of bowel or bladder. Does note some urinary frequency.  NO dysuria. No fevers, hx of IVDU or personal hx of cancer. No falls or trauma.  Has taken the ibuprofen and steroids prescribed but pain is persisting and increasing. It is worse with movements, coughing, laughing.   No abdominal pain.  Past Medical History:  Diagnosis Date   Asthma    Bradycardia     Home Medications Prior to Admission medications   Medication Sig Start Date End Date Taking? Authorizing Provider  cyclobenzaprine (FLEXERIL) 10 MG tablet Take 1 tablet (10 mg total) by mouth 2 (two) times daily as needed for muscle spasms. 01/16/22  Yes Alvira MondaySchlossman, Marnee Sherrard, MD  doxycycline (VIBRAMYCIN) 100 MG capsule Take 1 capsule (100 mg total) by mouth 2 (two) times daily for 7 days. 01/16/22 01/23/22 Yes Alvira MondaySchlossman, Derotha Fishbaugh, MD  methylPREDNISolone (MEDROL DOSEPAK) 4 MG TBPK tablet See package 01/16/22  Yes Alvira MondaySchlossman, Krisna Omar, MD  metroNIDAZOLE (FLAGYL) 500 MG tablet Take 1 tablet (500 mg total) by mouth 2 (two) times daily for 7 days. 01/16/22 01/23/22 Yes Alvira MondaySchlossman, Reyaansh Merlo, MD  oxyCODONE (ROXICODONE) 5 MG immediate release tablet Take 1 tablet (5 mg total) by mouth every 4 (four) hours as needed for severe pain. 01/16/22  Yes Alvira MondaySchlossman, Sande Pickert, MD  acetaminophen (TYLENOL) 325 MG tablet Take by mouth every 6 (six) hours as needed for moderate pain  or headache.    [provider]  ibuprofen (ADVIL) 600 MG tablet Take 1 tablet (600 mg total) by mouth every 6 (six) hours as needed. 12/25/21   Sherian Maroonobbins, Cooper A, PA  lidocaine (LIDODERM) 5 % Place 1 patch onto the skin daily. Remove & Discard patch within 12 hours or as directed by MD 12/25/21   Peter Garterobbins, Cooper A, PA      Allergies    Patient has no known allergies.    Review of Systems   Review of Systems  Physical Exam Updated Vital Signs BP 110/79 (BP Location: Left Arm)   Pulse (!) 43   Temp 97.8 F (36.6 C) (Oral)   Resp 16   Ht 4\' 11"  (1.499 m)   Wt 37.6 kg   LMP 12/10/2021   SpO2 100%   BMI 16.76 kg/m  Physical Exam Vitals and nursing note reviewed.  Constitutional:      General: She is not in acute distress.    Appearance: Normal appearance. She is not ill-appearing, toxic-appearing or diaphoretic.  HENT:     Head: Normocephalic.  Eyes:     Conjunctiva/sclera: Conjunctivae normal.  Cardiovascular:     Rate and Rhythm: Normal rate and regular rhythm.     Pulses: Normal pulses.  Pulmonary:     Effort: Pulmonary effort is normal. No respiratory distress.  Musculoskeletal:        General: Tenderness (lower back, left  lower back) present. No deformity or signs of injury.     Cervical back: No rigidity.  Skin:    General: Skin is warm and dry.     Coloration: Skin is not jaundiced or pale.  Neurological:     General: No focal deficit present.     Mental Status: She is alert and oriented to person, place, and time.     Motor: No weakness (normal strength and sesnation bilateral LE).     ED Results / Procedures / Treatments   Labs (all labs ordered are listed, but only abnormal results are displayed) Labs Reviewed  URINALYSIS, ROUTINE W REFLEX MICROSCOPIC - Abnormal; Notable for the following components:      Result Value   APPearance HAZY (*)    Hgb urine dipstick SMALL (*)    Leukocytes,Ua TRACE (*)    Bacteria, UA RARE (*)    Trichomonas, UA  PRESENT (*)    All other components within normal limits  POC URINE PREG, ED  GC/CHLAMYDIA PROBE AMP ([place]) NOT AT [facility]    EKG None  Radiology No results found.  Procedures Procedures    Medications Ordered in ED Medications  cefTRIAXone (ROCEPHIN) injection 500 mg (has no administration in time range)  ketorolac (TORADOL) injection 30 mg (30 mg Intramuscular Given 01/16/22 1359)  oxyCODONE (Oxy IR/ROXICODONE) immediate release tablet 5 mg (5 mg Oral Given 01/16/22 1358)  cyclobenzaprine (FLEXERIL) tablet 5 mg (5 mg Oral Given 01/16/22 1358)    ED Course/ Medical Decision Making/ A&P                           Medical Decision Making Amount and/or Complexity of Data Reviewed Labs: ordered.  Risk Prescription drug management.   41yo female presents with history of asthma, bradycardia, ED visit 5/23 for back pain presents with concern for continued lower back pain.  Patient has a normal neurologic exam and denies any urinary retention or overflow incontinence, stool incontinence, saddle anesthesia, fever, IV drug use, trauma, chronic steroid use or immunocompromise and have low suspicion suspicion for cauda equina, fracture, epidural abscess, or vertebral osteomyelitis.   No sign of intraabdominal pathology/dissection.  Urinalysis completed appears most consistent with STI with trichomonas noted. Given empiric therapy for GC/Chl and ordered urine testing.    Given rx for doxycycline, flagyl for trichomonas/chlamydia.  Reviewed in [place] drug database and given rx for oxycodone in addition to rx for flexeril, recommend ibuprofen/tylenol/lidocaine and given repeat steroid taper. Recommend close PCP follow up. Patient discharged in stable condition with understanding of reasons to return.         Final Clinical Impression(s) / ED Diagnoses Final diagnoses:  Acute left-sided low back pain with left-sided sciatica  Trichomonas infection    Rx / DC Orders ED Discharge  Orders          Ordered    doxycycline (VIBRAMYCIN) 100 MG capsule  2 times daily        01/16/22 1447    metroNIDAZOLE (FLAGYL) 500 MG tablet  2 times daily        01/16/22 1447    oxyCODONE (ROXICODONE) 5 MG immediate release tablet  Every 4 hours PRN        01/16/22 1447    cyclobenzaprine (FLEXERIL) 10 MG tablet  2 times daily PRN        01/16/22 1447    methylPREDNISolone (MEDROL DOSEPAK) 4 MG TBPK tablet  01/16/22 1447              Alvira Monday, MD 01/17/22 0000

## 2022-01-17 LAB — GC/CHLAMYDIA PROBE AMP (~~LOC~~) NOT AT ARMC
Chlamydia: NEGATIVE
Comment: NEGATIVE
Comment: NORMAL
Neisseria Gonorrhea: NEGATIVE

## 2022-07-20 IMAGING — DX DG LUMBAR SPINE COMPLETE 4+V
5 series · 5 of 5 positions shown · non-contrast
Comparison: None

CLINICAL DATA: Lifting injury with low back pain. Symptoms over the
last week.

EXAM:
LUMBAR SPINE - COMPLETE 4+ VIEW

[l-spine ap]
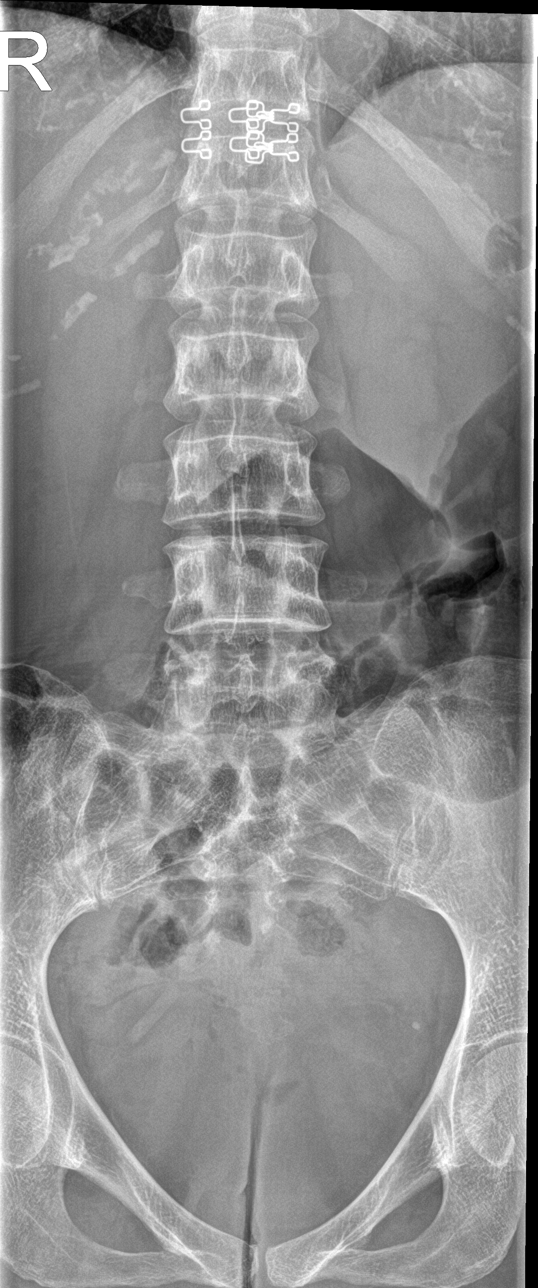

[l-spine obl (1 of 2)]
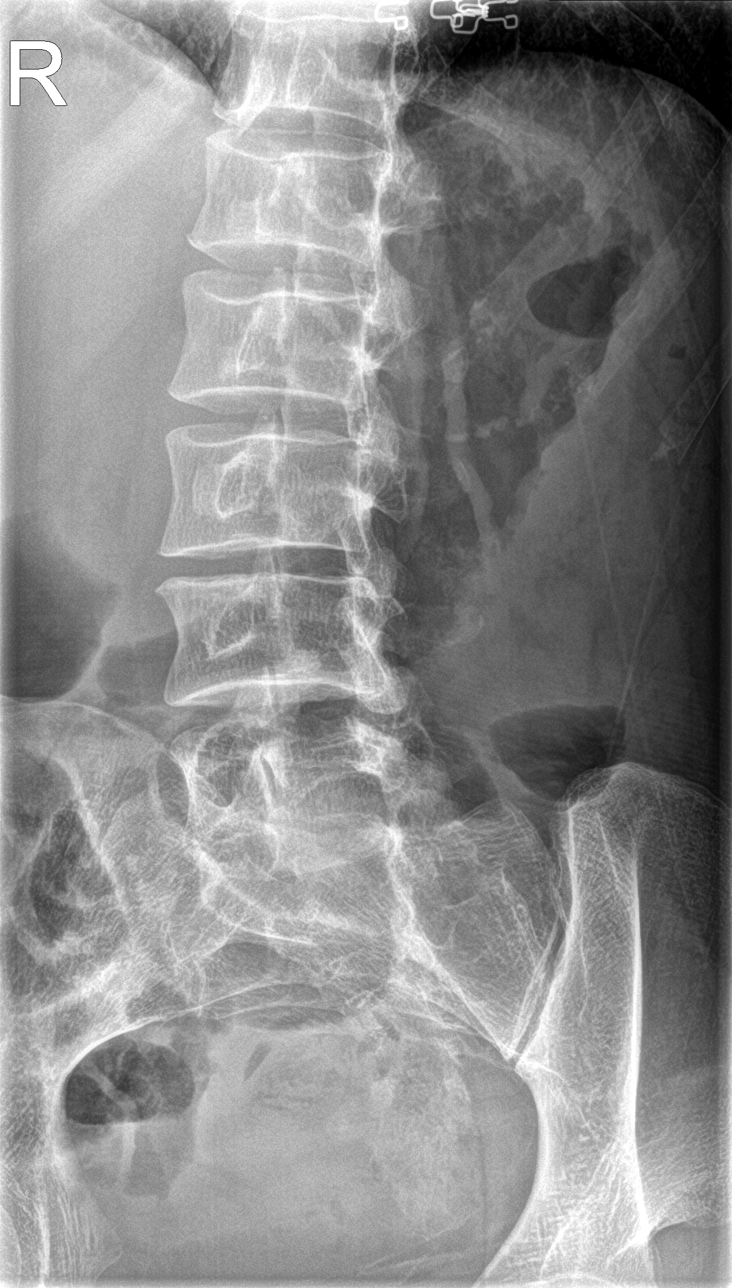

[l-spine obl (2 of 2)]
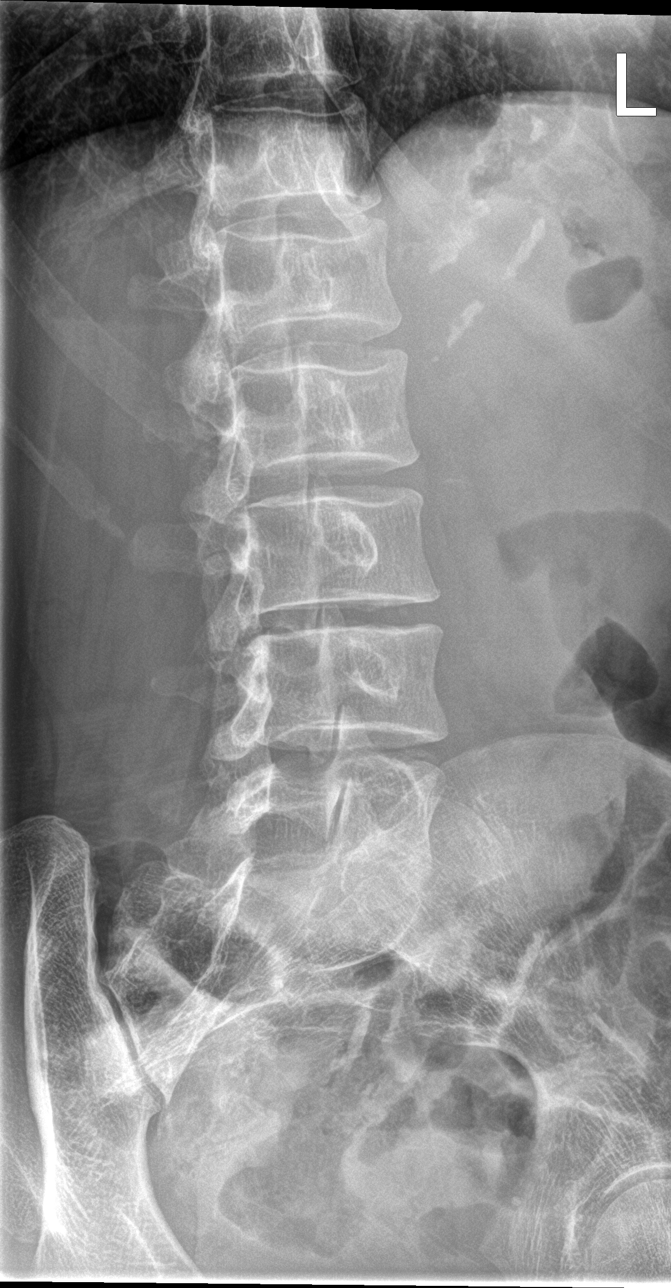

[l-spine lat]
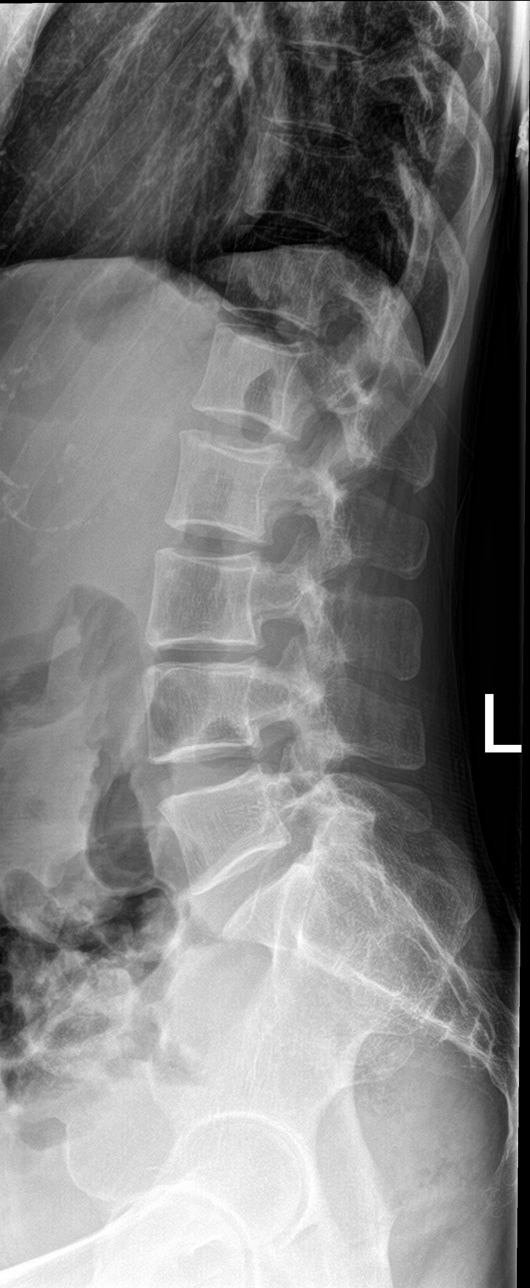

[l-spine spot]
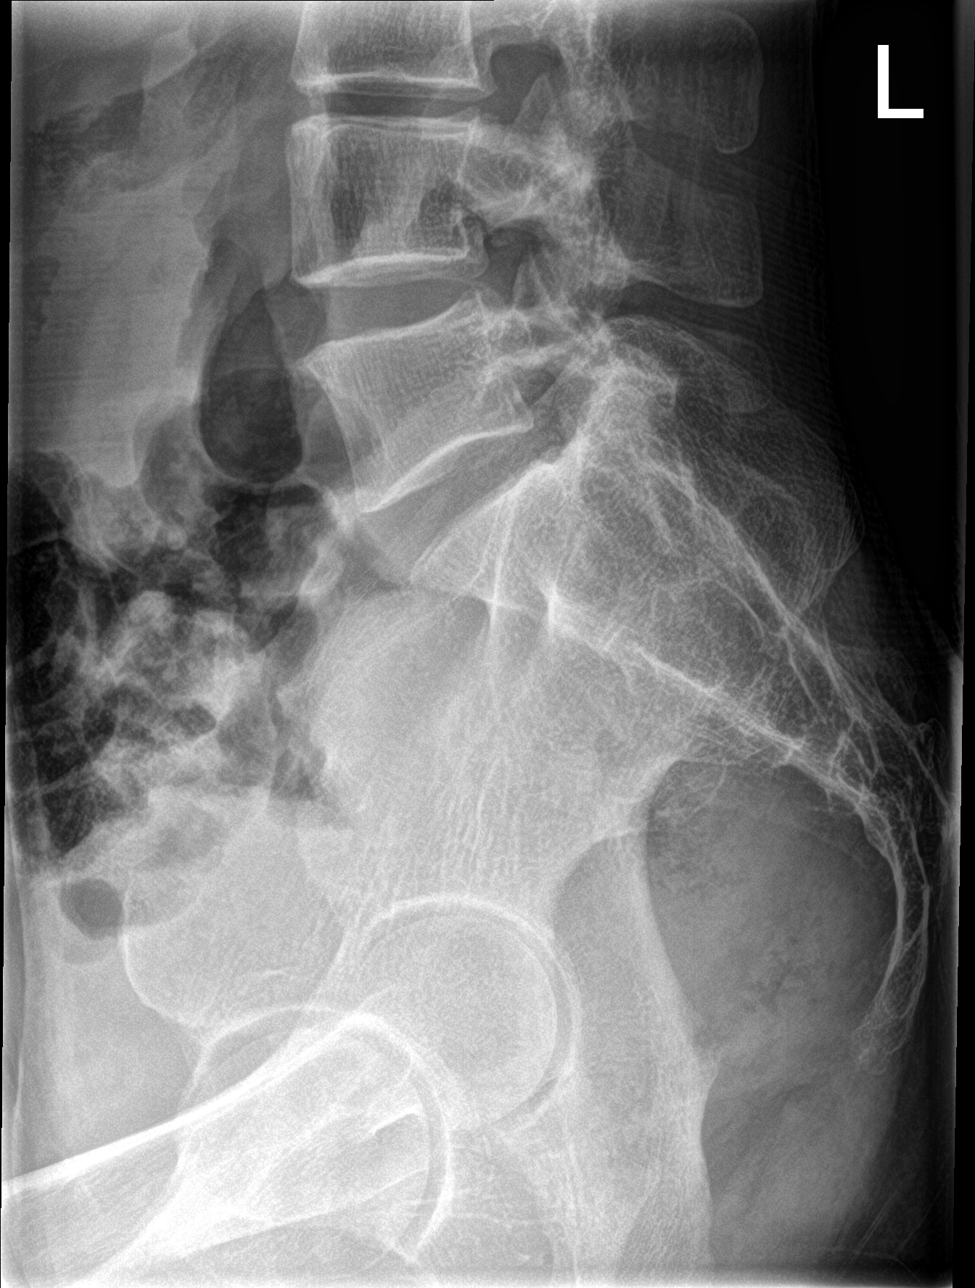

[5 of 5 positions shown; findings below may reference images not displayed]

FINDINGS: Normal alignment. Mild disc space narrowing at L3-4. Other disc
heights are normal. No evidence of fracture. No facet arthropathy or
pars defect. Sacroiliac joints appear normal.
IMPRESSION: No acute or traumatic finding.  Mild disc space narrowing L3-4.

## 2022-12-03 ENCOUNTER — Other Ambulatory Visit: Payer: Self-pay

## 2022-12-03 ENCOUNTER — Encounter (HOSPITAL_COMMUNITY): Payer: Self-pay

## 2022-12-03 ENCOUNTER — Emergency Department (HOSPITAL_COMMUNITY): Payer: BC Managed Care – PPO

## 2022-12-03 ENCOUNTER — Emergency Department (HOSPITAL_COMMUNITY)
Admission: EM | Admit: 2022-12-03 | Discharge: 2022-12-03 | Disposition: A | Discharge status: Home / Self Care | Payer: BC Managed Care – PPO | Attending: Emergency Medicine | Admitting: Emergency Medicine

## 2022-12-03 DIAGNOSIS — M25571 Pain in right ankle and joints of right foot: Secondary | ICD-10-CM | POA: Diagnosis present

## 2022-12-03 DIAGNOSIS — J45909 Unspecified asthma, uncomplicated: Secondary | ICD-10-CM | POA: Insufficient documentation

## 2022-12-03 MED ORDER — IBUPROFEN 200 MG PO TABS
600.0000 mg | ORAL_TABLET | Freq: Once | ORAL | Status: AC
  Administered 2022-12-03: 600 mg via ORAL
  Filled 2022-12-03: qty 3

## 2022-12-03 MED ORDER — HYDROCODONE-ACETAMINOPHEN 5-325 MG PO TABS
1.0000 | ORAL_TABLET | ORAL | Status: AC
  Administered 2022-12-03: 1 via ORAL
  Filled 2022-12-03: qty 1

## 2022-12-03 NOTE — Discharge Instructions (Signed)
You were seen for your ankle pain in the emergency department.   At home, please take Tylenol and ibuprofen and use ice for your ankle.    Check your MyChart online for the results of any tests that had not resulted by the time you left the emergency department.   Follow-up with your primary doctor in 2-3 days regarding your visit.    Return immediately to the emergency department if you experience any of the following: Worsening pain, high fevers, swelling of your ankle or leg, or any other concerning symptoms.    Thank you for visiting our Emergency Department. It was a pleasure taking care of you today.

## 2022-12-03 NOTE — ED Triage Notes (Signed)
BIB [facility] for right foot pain for 2 days. Denies injury. Pt has been having difficulty bearing weight since this morning.

## 2022-12-03 NOTE — ED Provider Notes (Signed)
[place] EMERGENCY DEPARTMENT AT [facility] Provider Note   CSN: [id number] Arrival date & time: 12/03/22  1211     History  Chief Complaint  Patient presents with   Foot Pain    Yolanda Chung is a 42 y.o. female.  42 year old female with a history of asthma who presents to the emergency department with right ankle pain.  Reports that 2 days ago started having pain in her right ankle.  Does not recall any injuries.  No swelling of the leg.  Says that it progressively worsened to the point where is hurting to bear weight today.  No history of IV drug use.  No history of DVT.  No surgeries on that leg.        Home Medications Prior to Admission medications   Medication Sig Start Date End Date Taking? Authorizing Provider  acetaminophen (TYLENOL) 325 MG tablet Take by mouth every 6 (six) hours as needed for moderate pain or headache.    [provider]  cyclobenzaprine (FLEXERIL) 10 MG tablet Take 1 tablet (10 mg total) by mouth 2 (two) times daily as needed for muscle spasms. 01/16/22   Alvira Monday, MD  ibuprofen (ADVIL) 600 MG tablet Take 1 tablet (600 mg total) by mouth every 6 (six) hours as needed. 12/25/21   Sherian Maroon A, PA  lidocaine (LIDODERM) 5 % Place 1 patch onto the skin daily. Remove & Discard patch within 12 hours or as directed by MD 12/25/21   Peter Garter, PA  methylPREDNISolone (MEDROL DOSEPAK) 4 MG TBPK tablet See package 01/16/22   Alvira Monday, MD  oxyCODONE (ROXICODONE) 5 MG immediate release tablet Take 1 tablet (5 mg total) by mouth every 4 (four) hours as needed for severe pain. 01/16/22   Alvira Monday, MD      Allergies    Patient has no known allergies.    Review of Systems   Review of Systems  Physical Exam Updated Vital Signs BP 119/85   Pulse 64   Temp 98.2 F (36.8 C) (Oral)   Resp 15   Ht 4\' 11"  (1.499 m)   Wt 37.6 kg   SpO2 100%   BMI 16.76 kg/m  Physical Exam Vitals and nursing note reviewed.   Constitutional:      General: She is not in acute distress.    Appearance: She is well-developed.  HENT:     Head: Normocephalic and atraumatic.     Right Ear: External ear normal.     Left Ear: External ear normal.     Nose: Nose normal.  Eyes:     Extraocular Movements: Extraocular movements intact.     Conjunctiva/sclera: Conjunctivae normal.     Pupils: Pupils are equal, round, and reactive to light.  Cardiovascular:     Rate and Rhythm: Normal rate and regular rhythm.  Pulmonary:     Effort: Pulmonary effort is normal. No respiratory distress.  Musculoskeletal:     Cervical back: Normal range of motion and neck supple.     Right lower leg: No edema.     Left lower leg: No edema.     Comments: Right ankle with 2+ DP pulse.  No effusion noted.  No warmth or erythema.  Patient still able to range ankle to plantarflexion of approximately 45 degrees but somewhat limited due to pain.  Does have tenderness to palpation of distal fibula.  No tenderness to palpation of the ankle joint itself or the foot.  Intact sensation light  touch.  No lower extremity edema noted.  Skin:    General: Skin is warm and dry.  Neurological:     Mental Status: She is alert and oriented to person, place, and time. Mental status is at baseline.  Psychiatric:        Mood and Affect: Mood normal.     ED Results / Procedures / Treatments   Labs (all labs ordered are listed, but only abnormal results are displayed) Labs Reviewed - No data to display  EKG None  Radiology DG Ankle Complete Right  Result Date: 12/03/2022 CLINICAL DATA:  r ankle pain EXAM: RIGHT ANKLE - COMPLETE 3+ VIEW; RIGHT TIBIA AND FIBULA - 2 VIEW COMPARISON:  None Available. FINDINGS: No acute fracture or dislocation. Joint spaces and alignment are maintained. No area of erosion or osseous destruction. No unexpected radiopaque foreign body. Soft tissues are unremarkable. IMPRESSION: No acute fracture or dislocation. Electronically  Signed   By: Meda Klinefelter M.D.   On: 12/03/2022 13:06   DG Tibia/Fibula Right  Result Date: 12/03/2022 CLINICAL DATA:  r ankle pain EXAM: RIGHT ANKLE - COMPLETE 3+ VIEW; RIGHT TIBIA AND FIBULA - 2 VIEW COMPARISON:  None Available. FINDINGS: No acute fracture or dislocation. Joint spaces and alignment are maintained. No area of erosion or osseous destruction. No unexpected radiopaque foreign body. Soft tissues are unremarkable. IMPRESSION: No acute fracture or dislocation. Electronically Signed   By: Meda Klinefelter M.D.   On: 12/03/2022 13:06    Procedures Procedures   Medications Ordered in ED Medications  HYDROcodone-acetaminophen (NORCO/VICODIN) 5-325 MG per tablet 1 tablet (1 tablet Oral Given 12/03/22 1246)  ibuprofen (ADVIL) tablet 600 mg (600 mg Oral Given 12/03/22 1246)    ED Course/ Medical Decision Making/ A&P                             Medical Decision Making Amount and/or Complexity of Data Reviewed Radiology: ordered.  Risk OTC drugs. Prescription drug management.   Yolanda Chung is a 42 y.o. female with comorbidities that complicate the patient evaluation including asthma presents to the emergency department with right ankle pain  Initial Ddx:  Ankle sprain, fracture, septic arthritis, inflammatory arthritis, overuse injury  MDM:  Unclear exactly what is causing the patient's pain at this time.  Will obtain x-rays to evaluate for fracture but feel this is less likely.  Could potentially be an overuse injury given the lack of trauma.  Given the fact that it is nontraumatic also considered septic inflammatory arthritis but no joint effusion or warmth noted of the joint.    Plan:  X-ray tib-fib and ankle  ED Summary/Re-evaluation:  Patient underwent x-ray which did not show any acute abnormalities.  Was having persistent difficulty with walking.  Will give her crutches and have her follow-up with sports medicine in several days to continue her  evaluation.  This patient presents to the ED for concern of complaints listed in HPI, this involves an extensive number of treatment options, and is a complaint that carries with it a high risk of complications and morbidity. Disposition including potential need for admission considered.   Dispo: DC Home. Return precautions discussed including, but not limited to, those listed in the AVS. Allowed pt time to ask questions which were answered fully prior to dc.  Records reviewed Outpatient Clinic Notes I independently reviewed the following imaging with scope of interpretation limited to determining acute life threatening conditions related to  emergency care: Extremity x-ray(s) and agree with the radiologist interpretation with the following exceptions: none I have reviewed the patients home medications and made adjustments as needed  Final Clinical Impression(s) / ED Diagnoses Final diagnoses:  Acute right ankle pain    Rx / DC Orders ED Discharge Orders     None         Rondel Baton, MD 12/03/22 [phone]

## 2023-12-08 ENCOUNTER — Other Ambulatory Visit: Payer: Self-pay

## 2023-12-08 ENCOUNTER — Ambulatory Visit (INDEPENDENT_AMBULATORY_CARE_PROVIDER_SITE_OTHER): Admitting: Physician Assistant

## 2023-12-08 VITALS — BP 121/77 | HR 88 | Ht 59.0 in | Wt 86.0 lb

## 2023-12-08 DIAGNOSIS — Z3202 Encounter for pregnancy test, result negative: Secondary | ICD-10-CM | POA: Diagnosis not present

## 2023-12-08 DIAGNOSIS — Z3200 Encounter for pregnancy test, result unknown: Secondary | ICD-10-CM

## 2023-12-08 LAB — POCT PREGNANCY, URINE: Preg Test, Ur: NEGATIVE

## 2023-12-08 NOTE — Progress Notes (Signed)
 Here for pregnancy test. States she is late for her period. States she had positive pregnancy test at home but states FOB injured himself and was told he can't Father a baby. She c/o nausea. UPT in office negative. Serum upt ordered. Explained if negative and continues to have issues with period or pain or nausea can make appointment with provider. She voices understanding. Yolanda Chung

## 2023-12-09 LAB — BETA HCG QUANT (REF LAB): hCG Quant: <1 m[IU]/mL

## 2023-12-10 ENCOUNTER — Encounter: Payer: Self-pay | Admitting: Obstetrics and Gynecology

## 2024-05-30 ENCOUNTER — Ambulatory Visit (HOSPITAL_COMMUNITY)
Admission: EM | Admit: 2024-05-30 | Discharge: 2024-05-30 | Disposition: A | Discharge status: Home / Self Care | Attending: Nurse Practitioner | Admitting: Nurse Practitioner

## 2024-05-30 ENCOUNTER — Encounter (HOSPITAL_COMMUNITY): Payer: Self-pay | Admitting: *Deleted

## 2024-05-30 ENCOUNTER — Other Ambulatory Visit: Payer: Self-pay

## 2024-05-30 DIAGNOSIS — R051 Acute cough: Secondary | ICD-10-CM | POA: Diagnosis not present

## 2024-05-30 DIAGNOSIS — J069 Acute upper respiratory infection, unspecified: Secondary | ICD-10-CM | POA: Diagnosis not present

## 2024-05-30 LAB — POC COVID19/FLU A&B COMBO
Covid Antigen, POC: NEGATIVE
Influenza A Antigen, POC: NEGATIVE
Influenza B Antigen, POC: NEGATIVE

## 2024-05-30 MED ORDER — CETIRIZINE-PSEUDOEPHEDRINE ER 5-120 MG PO TB12: 1.0000 | ORAL_TABLET | Freq: Every day | ORAL | 0 refills | Status: AC

## 2024-05-30 MED ORDER — PROMETHAZINE-DM 6.25-15 MG/5ML PO SYRP: 10.0000 mL | ORAL_SOLUTION | Freq: Four times a day (QID) | ORAL | 0 refills | Status: AC | PRN

## 2024-05-30 MED ORDER — ALBUTEROL SULFATE HFA 108 (90 BASE) MCG/ACT IN AERS: 2.0000 | INHALATION_SPRAY | RESPIRATORY_TRACT | 0 refills | Status: AC | PRN

## 2024-05-30 MED ORDER — IPRATROPIUM-ALBUTEROL 0.5-2.5 (3) MG/3ML IN SOLN
3.0000 mL | Freq: Once | RESPIRATORY_TRACT | Status: AC
  Administered 2024-05-30: 3 mL via RESPIRATORY_TRACT

## 2024-05-30 NOTE — ED Triage Notes (Signed)
 For 3 Days Pt has had a cough ,RT ear pain and wheezing at night when going to bed.

## 2024-05-30 NOTE — ED Provider Notes (Signed)
 [facility]    CSN: [id number] Arrival date & time: 05/30/24  1125      History   Chief Complaint Chief Complaint  Patient presents with   Otalgia   Wheezing   Cough    HPI Yolanda Chung is a 43 y.o. female.   Discussed the use of AI scribe software for clinical note transcription with the patient, who gave verbal consent to proceed.   Patient presents with a 3-day history of cough, right ear pain, nighttime wheezing, and throat discomfort. The cough has triggered post-tussive vomiting. Wheezing is primarily noted when lying down and improves when upright. Patient also reports headache. She denies fever, chills, body aches, nasal congestion, runny nose, or current shortness of breath outside of coughing episodes. She denies nausea, vomiting, or diarrhea not associated with coughing. She reports recent sick exposure in the household and believes symptoms began after that contact. She took Tylenol  yesterday with some relief but has not taken any medications today. Patient reports smoking.  The following sections of the patient's history were reviewed and updated as appropriate: allergies, current medications, past family history, past medical history, past social history, past surgical history, and problem list.     Past Medical History:  Diagnosis Date   Asthma    Bradycardia     Patient Active Problem List   Diagnosis Date Noted   Left breast lump 05/25/2018    History reviewed. No pertinent surgical history.  OB History   No obstetric history on file.      Home Medications    Prior to Admission medications   Medication Sig Start Date End Date Taking? Authorizing Provider  albuterol  (VENTOLIN  HFA) 108 (90 Base) MCG/ACT inhaler Inhale 2 puffs into the lungs every 4 (four) hours as needed for wheezing or shortness of breath (bronchospasms). 05/30/24  Yes Tomicka Lover, Lucie, FNP  cetirizine-pseudoephedrine (ZYRTEC-D) 5-120 MG tablet Take 1 tablet by mouth  daily with breakfast for 10 days. 05/30/24 06/09/24 Yes Iola Lucie, FNP  promethazine-dextromethorphan (PROMETHAZINE-DM) 6.25-15 MG/5ML syrup Take 10 mLs by mouth every 6 (six) hours as needed for cough. 05/30/24  Yes Iola Lucie, FNP  oxyCODONE  (ROXICODONE ) 5 MG immediate release tablet Take 1 tablet (5 mg total) by mouth every 4 (four) hours as needed for severe pain. 01/16/22   Dreama Longs, MD    Family History Family History  Problem Relation Age of Onset   Seizures Mother     Social History Social History   Tobacco Use   Smoking status: Every Day   Smokeless tobacco: Never  Substance Use Topics   Alcohol use: No   Drug use: No     Allergies   Patient has no known allergies.   Review of Systems Review of Systems  Constitutional:  Negative for chills and fever.  HENT:  Positive for ear pain (right) and sore throat. Negative for congestion, rhinorrhea and sneezing.   Respiratory:  Positive for cough (productive) and wheezing (mostly at night when laying down). Negative for shortness of breath.   Gastrointestinal:  Positive for vomiting (due to coughing). Negative for diarrhea and nausea.  Musculoskeletal:  Negative for myalgias.  Neurological:  Positive for headaches.  Psychiatric/Behavioral:  Positive for sleep disturbance.   All other systems reviewed and are negative.    Physical Exam Triage Vital Signs ED Triage Vitals  Encounter Vitals Group     BP 05/30/24 1203 123/78     Girls Systolic BP Percentile --      Girls  Diastolic BP Percentile --      Boys Systolic BP Percentile --      Boys Diastolic BP Percentile --      Pulse Rate 05/30/24 1203 (!) 48     Resp 05/30/24 1203 18     Temp 05/30/24 1203 98.2 F (36.8 C)     Temp src --      SpO2 05/30/24 1203 98 %     Weight --      Height --      Head Circumference --      Peak Flow --      Pain Score 05/30/24 1200 0     Pain Loc --      Pain Education --      Exclude from Growth Chart  --    No data found.  Updated Vital Signs BP 123/78   Pulse (!) 48   Temp 98.2 F (36.8 C)   Resp 18   LMP 05/02/2024 (Approximate)   SpO2 98%   Visual Acuity Right Eye Distance:   Left Eye Distance:   Bilateral Distance:    Right Eye Near:   Left Eye Near:    Bilateral Near:     Physical Exam Vitals reviewed.  Constitutional:      General: She is awake. She is not in acute distress.    Appearance: Normal appearance. She is well-developed. She is not ill-appearing, toxic-appearing or diaphoretic.  HENT:     Head: Normocephalic.     Right Ear: Hearing, tympanic membrane, ear canal and external ear normal.     Left Ear: Hearing, tympanic membrane, ear canal and external ear normal.     Nose: Congestion present. No rhinorrhea.     Mouth/Throat:     Lips: Pink.     Mouth: Mucous membranes are moist.     Pharynx: Oropharynx is clear. Uvula midline. No pharyngeal swelling, oropharyngeal exudate, posterior oropharyngeal erythema or uvula swelling.     Tonsils: No tonsillar exudate or tonsillar abscesses.  Eyes:     General: Vision grossly intact.     Conjunctiva/sclera: Conjunctivae normal.  Cardiovascular:     Rate and Rhythm: Normal rate.     Heart sounds: Normal heart sounds.  Pulmonary:     Effort: Pulmonary effort is normal. No tachypnea or respiratory distress.     Breath sounds: Normal breath sounds and air entry.     Comments: Mild expiratory wheezing noted throughout the bases. Respirations even and unlabored  Musculoskeletal:        General: Normal range of motion.     Cervical back: Normal range of motion and neck supple.  Lymphadenopathy:     Cervical: No cervical adenopathy.  Skin:    General: Skin is warm and dry.  Neurological:     General: No focal deficit present.     Mental Status: She is alert and oriented to person, place, and time.  Psychiatric:        Behavior: Behavior is cooperative.      UC Treatments / Results  Labs (all labs ordered  are listed, but only abnormal results are displayed) Labs Reviewed  POC COVID19/FLU A&B COMBO    EKG   Radiology No results found.  Procedures Procedures (including critical care time)  Medications Ordered in UC Medications  ipratropium-albuterol  (DUONEB) 0.5-2.5 (3) MG/3ML nebulizer solution 3 mL (3 mLs Nebulization Given 05/30/24 1231)    Initial Impression / Assessment and Plan / UC Course  I have reviewed the  triage vital signs and the nursing notes.  Pertinent labs & imaging results that were available during my care of the patient were reviewed by me and considered in my medical decision making (see chart for details).     The patient presents with symptoms consistent with a viral upper respiratory infection. COVID & FLU testing negative. Mild expiratory wheezing noted on exam. DUONEB given. Supportive care is recommended. Patient was advised to follow up with primary care if symptoms do not improve within one week or if new concerns arise. Instructions were given to seek emergency care if symptoms worsen, including shortness of breath, chest pain, persistent high fever, inability to tolerate fluids, or confusion.  Today's evaluation has revealed no signs of a dangerous process. Discussed diagnosis with patient and/or guardian. Patient and/or guardian aware of their diagnosis, possible red flag symptoms to watch out for and need for close follow up. Patient and/or guardian understands verbal and written discharge instructions. Patient and/or guardian comfortable with plan and disposition.  Patient and/or guardian has a clear mental status at this time, good insight into illness (after discussion and teaching) and has clear judgment to make decisions regarding their care  Documentation was completed with the aid of voice recognition software. Transcription may contain typographical errors.   Final Clinical Impressions(s) / UC Diagnoses   Final diagnoses:  Acute cough  Viral  upper respiratory tract infection     Discharge Instructions      Your symptoms today are most consistent with a viral upper respiratory infection. Your COVID and flu tests were negative. On your exam, there was some mild wheezing when you breathed out, so you were given a breathing treatment to help open the airways. Viral infections usually improve on their own over several days. For symptom relief at home, drink plenty of fluids, rest, and consider using warm tea with honey to soothe coughing. You may use over-the-counter medications such as Tylenol  or ibuprofen  for fever, sore throat, or body discomfort, as long as these are safe for you. A humidifier or steamy shower may also help with chest congestion and cough. Avoid smoking or vaping while you are recovering, as these can make symptoms worse. If you were prescribed an inhaler, use it as directed when you feel tightness or wheezing. Please follow up with your primary care provider if your symptoms are not improving within one week, or sooner if you develop new concerns. Go to the emergency room if you have trouble breathing, chest pain, repeated vomiting, a fever that does not come down with medication, are unable to drink or stay hydrated, feel unusually weak, or notice confusion or worsening symptoms. If anything does not feel right or you are uncertain, it is always better to seek care.     ED Prescriptions     Medication Sig Dispense Auth. Provider   albuterol  (VENTOLIN  HFA) 108 (90 Base) MCG/ACT inhaler Inhale 2 puffs into the lungs every 4 (four) hours as needed for wheezing or shortness of breath (bronchospasms). 18 g Iola Lukes, FNP   promethazine-dextromethorphan (PROMETHAZINE-DM) 6.25-15 MG/5ML syrup Take 10 mLs by mouth every 6 (six) hours as needed for cough. 118 mL Ernesta Trabert, [address], FNP   cetirizine-pseudoephedrine (ZYRTEC-D) 5-120 MG tablet Take 1 tablet by mouth daily with breakfast for 10 days. 10 tablet Iola Lukes, FNP      PDMP not reviewed this encounter.   Iola Lukes, [place] 05/30/24 [phone]

## 2024-05-30 NOTE — Discharge Instructions (Addendum)
 Your symptoms today are most consistent with a viral upper respiratory infection. Your COVID and flu tests were negative. On your exam, there was some mild wheezing when you breathed out, so you were given a breathing treatment to help open the airways. Viral infections usually improve on their own over several days. For symptom relief at home, drink plenty of fluids, rest, and consider using warm tea with honey to soothe coughing. You may use over-the-counter medications such as Tylenol  or ibuprofen  for fever, sore throat, or body discomfort, as long as these are safe for you. A humidifier or steamy shower may also help with chest congestion and cough. Avoid smoking or vaping while you are recovering, as these can make symptoms worse. If you were prescribed an inhaler, use it as directed when you feel tightness or wheezing. Please follow up with your primary care provider if your symptoms are not improving within one week, or sooner if you develop new concerns. Go to the emergency room if you have trouble breathing, chest pain, repeated vomiting, a fever that does not come down with medication, are unable to drink or stay hydrated, feel unusually weak, or notice confusion or worsening symptoms. If anything does not feel right or you are uncertain, it is always better to seek care.

## 2024-07-31 ENCOUNTER — Emergency Department (HOSPITAL_COMMUNITY)
Admission: EM | Admit: 2024-07-31 | Discharge: 2024-07-31 | Disposition: A | Discharge status: Home / Self Care | Attending: Emergency Medicine | Admitting: Emergency Medicine

## 2024-07-31 ENCOUNTER — Emergency Department (HOSPITAL_COMMUNITY)

## 2024-07-31 DIAGNOSIS — R059 Cough, unspecified: Secondary | ICD-10-CM | POA: Diagnosis present

## 2024-07-31 DIAGNOSIS — J069 Acute upper respiratory infection, unspecified: Secondary | ICD-10-CM | POA: Diagnosis not present

## 2024-07-31 LAB — CBC
HCT: 39.8 % (ref 36.0–46.0)
Hemoglobin: 13.7 g/dL (ref 12.0–15.0)
MCH: 33.3 pg (ref 26.0–34.0)
MCHC: 34.4 g/dL (ref 30.0–36.0)
MCV: 96.8 fL (ref 80.0–100.0)
Platelets: 193 K/uL (ref 150–400)
RBC: 4.11 MIL/uL (ref 3.87–5.11)
RDW: 13.1 % (ref 11.5–15.5)
WBC: 7.3 K/uL (ref 4.0–10.5)
nRBC: 0 % (ref 0.0–0.2)

## 2024-07-31 LAB — BASIC METABOLIC PANEL WITH GFR
Anion gap: 14 (ref 5–15)
BUN: 7 mg/dL (ref 6–20)
CO2: 22 mmol/L (ref 22–32)
Calcium: 9.6 mg/dL (ref 8.9–10.3)
Chloride: 99 mmol/L (ref 98–111)
Creatinine, Ser: 0.69 mg/dL (ref 0.44–1.00)
GFR, Estimated: >60 mL/min
Glucose, Bld: 118 mg/dL — ABNORMAL HIGH (ref 70–99)
Potassium: 3.8 mmol/L (ref 3.5–5.1)
Sodium: 135 mmol/L (ref 135–145)

## 2024-07-31 LAB — TROPONIN T, HIGH SENSITIVITY: Troponin T High Sensitivity: <15 ng/L (ref 0–19)

## 2024-07-31 MED ORDER — ACETAMINOPHEN 325 MG PO TABS
975.0000 mg | ORAL_TABLET | Freq: Once | ORAL | Status: AC
  Administered 2024-07-31: 975 mg via ORAL
  Filled 2024-07-31: qty 3

## 2024-07-31 MED ORDER — DM-GUAIFENESIN ER 30-600 MG PO TB12: 1.0000 | ORAL_TABLET | Freq: Two times a day (BID) | ORAL | 0 refills | Status: AC

## 2024-07-31 NOTE — Discharge Instructions (Signed)
 Thank you for letting us  evaluate you today.  Your cardiac workup was negative for cardiac injury.  Your chest x-ray did not show any pneumonia.  Your COVID, flu, RSV test will take 1-2 days to result.  You may check on your MyChart for results.  Regardless, we will treat symptoms secondary to virus with plenty of oral hydration, Tylenol , Motrin  intermittently every 4 hours for body aches, fever greater than 100.4 F.  Did send Mucinex  to your pharmacy.  Use this for cough, congestion  Return to Emergency Department for experiencing difficult worsening symptoms, intractable vomiting causing be unable to keep fluids down

## 2024-07-31 NOTE — ED Provider Notes (Signed)
 " [place] EMERGENCY DEPARTMENT AT [facility] Provider Note   CSN: [id number] Arrival date & time: 07/31/24  1440     Patient presents with: URI   Yolanda Chung is a 43 y.o. female presents Emergency Department for evaluation of productive cough, congestion, chest pain that started last night.  Chest pain is constant and nonexertional.  Worsens with coughing.  Unknown sick contacts.  Has been drinking water appropriately.  Denies shortness of breath    URI Presenting symptoms: congestion        Prior to Admission medications  Medication Sig Start Date End Date Taking? Authorizing Provider  dextromethorphan-guaiFENesin  (MUCINEX  DM) 30-600 MG 12hr tablet Take 1 tablet by mouth 2 (two) times daily for 10 days. 07/31/24 08/10/24 Yes Minnie Tinnie BRAVO, PA  albuterol  (VENTOLIN  HFA) 108 (90 Base) MCG/ACT inhaler Inhale 2 puffs into the lungs every 4 (four) hours as needed for wheezing or shortness of breath (bronchospasms). 05/30/24   Iola Lukes, FNP  oxyCODONE  (ROXICODONE ) 5 MG immediate release tablet Take 1 tablet (5 mg total) by mouth every 4 (four) hours as needed for severe pain. 01/16/22   Dreama Longs, MD  promethazine -dextromethorphan (PROMETHAZINE -DM) 6.25-15 MG/5ML syrup Take 10 mLs by mouth every 6 (six) hours as needed for cough. 05/30/24   Murrill, Samantha, FNP    Allergies: Patient has no known allergies.    Review of Systems  HENT:  Positive for congestion.     Updated Vital Signs BP 109/85 (BP Location: Right Arm)   Pulse 74   Temp (!) 100.4 F (38 C) (Oral)   Resp (!) 22   Ht 4' 11 (1.499 m)   Wt 36.3 kg   LMP 07/05/2024   SpO2 96%   BMI 16.16 kg/m   Physical Exam Vitals and nursing note reviewed.  Constitutional:      General: She is not in acute distress.    Appearance: Normal appearance. She is not ill-appearing.  HENT:     Head: Normocephalic and atraumatic.     Right Ear: Tympanic membrane, ear canal and external ear normal.      Left Ear: Tympanic membrane, ear canal and external ear normal.     Mouth/Throat:     Mouth: Mucous membranes are moist.     Pharynx: No oropharyngeal exudate or posterior oropharyngeal erythema.     Comments: Uvula midline. No abscess, fluctuance, erythema noted in mouth Eyes:     General: No scleral icterus.       Right eye: No discharge.        Left eye: No discharge.     Conjunctiva/sclera: Conjunctivae normal.  Cardiovascular:     Rate and Rhythm: Tachycardia present.     Pulses: Normal pulses.  Pulmonary:     Effort: Pulmonary effort is normal. No respiratory distress.     Breath sounds: Normal breath sounds. No stridor. No wheezing or rhonchi.  Chest:     Chest wall: No tenderness.  Abdominal:     General: There is no distension.     Palpations: Abdomen is soft. There is no mass.     Tenderness: There is no abdominal tenderness. There is no guarding.  Musculoskeletal:     Cervical back: Normal range of motion and neck supple. No rigidity or tenderness.  Lymphadenopathy:     Cervical: No cervical adenopathy.  Skin:    General: Skin is warm.     Capillary Refill: Capillary refill takes less than 2 seconds.  Coloration: Skin is not jaundiced or pale.  Neurological:     Mental Status: She is alert and oriented to person, place, and time. Mental status is at baseline.     (all labs ordered are listed, but only abnormal results are displayed) Labs Reviewed  BASIC METABOLIC PANEL WITH GFR - Abnormal; Notable for the following components:      Result Value   Glucose, Bld 118 (*)    All other components within normal limits  CBC  MISC [facility] TEST (SEND OUT)  TROPONIN T, HIGH SENSITIVITY    EKG: None  Radiology: DG Chest 2 View Result Date: 07/31/2024 CLINICAL DATA:  Cough, congestion and chest pain. EXAM: CHEST - 2 VIEW COMPARISON:  July 03, 2017 FINDINGS: The heart size and mediastinal contours are within normal limits. Both lungs are clear. The  visualized skeletal structures are unremarkable. IMPRESSION: No active cardiopulmonary disease. Electronically Signed   By: Suzen Dials M.D.   On: 07/31/2024 16:25     Medications Ordered in the ED  acetaminophen  (TYLENOL ) tablet 975 mg (975 mg Oral Given 07/31/24 1559)                                    Medical Decision Making Amount and/or Complexity of Data Reviewed Labs: ordered. Radiology: ordered.  Risk OTC drugs.   Patient presents to the ED for concern of cough, congestion, chest pain, this involves an extensive number of treatment options, and is a complaint that carries with it a high risk of complications and morbidity.  The differential diagnosis includes COVID, flu, RSV, ACS, pneumonia   Co morbidities that complicate the patient evaluation  None   Additional history obtained:  Additional history obtained from Nursing   External records from outside source obtained and reviewed including triage RN note   Lab Tests:  I Ordered, and personally interpreted labs.  The pertinent results include:   First troponin negative CBG 118 Respiratory panel pending   Imaging Studies ordered:  I ordered imaging studies including chest x-ray I independently visualized and interpreted imaging which showed no acute cardiopulmonary changes I agree with the radiologist interpretation   Cardiac Monitoring:  The patient was maintained on a cardiac monitor.  I personally viewed and interpreted the cardiac monitored which showed an underlying rhythm of: NSR at 65 bpm with no ischemic changes   Medicines ordered and prescription drug management:  I ordered prescription medication including Mucinex  DM for cough, congestion I have reviewed the patients home medicines and have made adjustments as needed    Problem List / ED Course:  Cough Congestion No signs given examination with no tachycardia.  Did have mild temperature of 100.4 Fahrenheit.  Was provided  Tylenol  in ED. Respiratory panel send out.  Discussed that this will take 1-2 days for results and she can follow her results on MyChart Chest x-ray without pneumonia nor other cardiopulmonary pathology Regardless of results will treat viral URI as a virus with symptomatic treatment.  Did send Mucinex  DM for symptoms. Discussed Tylenol , ibuprofen  as needed for body aches, pain, fever.  Discussed importance of oral hydration.  CP EKG without ischemic changes Nonexertional.  It worsens with coughing.  Pleuritic. First troponin is negative.  Chest pain has been present since since last night so does not need repeat. No complaints of shortness of breath.  PERC negative.  No tachycardia nor hypoxia while in ED.  Low  suspicion for PE   Reevaluation:  After the interventions noted above, I reevaluated the patient and found that they have :stayed the same    Dispostion:  After consideration of the diagnostic results and the patients response to treatment, I feel that the patent would benefit from outpatient management symptomatic treatment.   Discussed ED workup, disposition, return to ED precautions with patient who expresses understanding agrees with plan.  All questions answered to their satisfaction.  They are agreeable to plan.  Discharge instructions provided on paperwork  Final diagnoses:  Viral URI with cough    ED Discharge Orders          Ordered    dextromethorphan-guaiFENesin  (MUCINEX  DM) 30-600 MG 12hr tablet  2 times daily        07/31/24 1704             Minnie Tinnie BRAVO, PA 07/31/24 1705    Patsey Lot, MD 07/31/24 2328  "

## 2024-07-31 NOTE — ED Provider Triage Note (Signed)
 Emergency Medicine Provider Triage Evaluation Note  Yolanda Chung , a 43 y.o. female  was evaluated in triage.  Pt complains of cough, congestion, cp.  Constant nonexertional chest pain. Worsens with coughing Productive cough started last night  Review of Systems  Positive: See hpi Negative:   Physical Exam  BP 109/85 (BP Location: Right Arm)   Pulse 74   Temp (!) 100.4 F (38 C) (Oral)   Resp (!) 22   Ht 4' 11 (1.499 m)   Wt 36.3 kg   SpO2 96%   BMI 16.16 kg/m  Gen:   Awake, no distress   Resp:  Normal effort  MSK:   Moves extremities without difficulty  Other:    Medical Decision Making  Medically screening exam initiated at 3:40 PM.  Appropriate orders placed.  Yolanda Chung was informed that the remainder of the evaluation will be completed by another provider, this initial triage assessment does not replace that evaluation, and the importance of remaining in the ED until their evaluation is complete.  100.46F in triage - tylenol  ordered CXR ordered. Cardiac enzymes and EKG ordered Send out resp panel   Yolanda Tinnie BRAVO, PA 07/31/24 1542

## 2024-07-31 NOTE — ED Triage Notes (Signed)
 Patient reports body aches Headaches Fever Phlegm Started last night   Pain rated 8/10

## 2024-08-01 LAB — MISC LABCORP TEST (SEND OUT)
LabCorp test name: 140140
Labcorp test code: 140140

## 2024-08-09 ENCOUNTER — Ambulatory Visit
Admission: EM | Admit: 2024-08-09 | Discharge: 2024-08-09 | Disposition: A | Discharge status: Home / Self Care | Attending: Family Medicine | Admitting: Family Medicine

## 2024-08-09 ENCOUNTER — Encounter: Payer: Self-pay | Admitting: Emergency Medicine

## 2024-08-09 DIAGNOSIS — J4521 Mild intermittent asthma with (acute) exacerbation: Secondary | ICD-10-CM

## 2024-08-09 DIAGNOSIS — J209 Acute bronchitis, unspecified: Secondary | ICD-10-CM | POA: Diagnosis not present

## 2024-08-09 MED ORDER — AMOXICILLIN-POT CLAVULANATE 875-125 MG PO TABS: 1.0000 | ORAL_TABLET | Freq: Two times a day (BID) | ORAL | 0 refills | Status: AC

## 2024-08-09 MED ORDER — FLUTICASONE PROPIONATE 50 MCG/ACT NA SUSP: 1.0000 | Freq: Every day | NASAL | 0 refills | Status: AC

## 2024-08-09 MED ORDER — PREDNISONE 10 MG PO TABS: 30.0000 mg | ORAL_TABLET | Freq: Every day | ORAL | 0 refills | Status: AC

## 2024-08-09 MED ORDER — IPRATROPIUM-ALBUTEROL 0.5-2.5 (3) MG/3ML IN SOLN
3.0000 mL | Freq: Once | RESPIRATORY_TRACT | Status: AC
  Administered 2024-08-09: 3 mL via RESPIRATORY_TRACT

## 2024-08-09 NOTE — ED Provider Notes (Signed)
 " [facility]    CSN: [id number] Arrival date & time: 08/09/24  9070      History   Chief Complaint Chief Complaint  Patient presents with   Cough    HPI Yolanda Chung is a 44 y.o. female  presents for evaluation of URI symptoms for 7-8 days. Patient reports associated symptoms of cough, congestion, wheezing and shortness of breath. Denies N/V/D, fevers, sore throat, ear pain, body aches. Patient does have a hx of asthma.  Has been using her inhaler with temporary improvement in symptoms.  Patient denies smoking history.  Reports son also has similar symptoms.  She was seen in the emergency room on 12/27 for first day of the symptoms.  Chest x-ray was negative and she was discharged on cough medicine.  Pt has taken nothing OTC for symptoms. Pt has no other concerns at this time.    Cough Associated symptoms: shortness of breath and wheezing     Past Medical History:  Diagnosis Date   Asthma    Bradycardia     Patient Active Problem List   Diagnosis Date Noted   Left breast lump 05/25/2018    History reviewed. No pertinent surgical history.  OB History   No obstetric history on file.      Home Medications    Prior to Admission medications  Medication Sig Start Date End Date Taking? Authorizing Provider  amoxicillin -clavulanate (AUGMENTIN ) 875-125 MG tablet Take 1 tablet by mouth every 12 (twelve) hours for 7 days. 08/09/24 08/16/24 Yes Hulet Ehrmann, Jodi R, NP  fluticasone  (FLONASE ) 50 MCG/ACT nasal spray Place 1 spray into both nostrils daily. 08/09/24  Yes Torre Pikus, Jodi R, NP  predniSONE  (DELTASONE ) 10 MG tablet Take 3 tablets (30 mg total) by mouth daily for 5 days. 08/09/24 08/14/24 Yes Ziv Welchel, Jodi R, NP  albuterol  (VENTOLIN  HFA) 108 (90 Base) MCG/ACT inhaler Inhale 2 puffs into the lungs every 4 (four) hours as needed for wheezing or shortness of breath (bronchospasms). 05/30/24   Murrill, Samantha, FNP  dextromethorphan-guaiFENesin  (MUCINEX  DM) 30-600 MG 12hr tablet  Take 1 tablet by mouth 2 (two) times daily for 10 days. 07/31/24 08/10/24  Minnie Tinnie BRAVO, PA  oxyCODONE  (ROXICODONE ) 5 MG immediate release tablet Take 1 tablet (5 mg total) by mouth every 4 (four) hours as needed for severe pain. Patient not taking: Reported on 08/09/2024 01/16/22   Dreama Longs, MD  promethazine -dextromethorphan (PROMETHAZINE -DM) 6.25-15 MG/5ML syrup Take 10 mLs by mouth every 6 (six) hours as needed for cough. 05/30/24   Iola Lukes, FNP    Family History Family History  Problem Relation Age of Onset   Seizures Mother     Social History Social History[1]   Allergies   Patient has no known allergies.   Review of Systems Review of Systems  HENT:  Positive for congestion.   Respiratory:  Positive for cough, shortness of breath and wheezing.      Physical Exam Triage Vital Signs ED Triage Vitals  Encounter Vitals Group     BP 08/09/24 1040 (!) 144/83     Girls Systolic BP Percentile --      Girls Diastolic BP Percentile --      Boys Systolic BP Percentile --      Boys Diastolic BP Percentile --      Pulse Rate 08/09/24 1040 65     Resp 08/09/24 1040 20     Temp 08/09/24 1040 98 F (36.7 C)     Temp Source 08/09/24 1040 Oral  SpO2 08/09/24 1040 96 %     Weight 08/09/24 1041 85 lb (38.6 kg)     Height 08/09/24 1041 4' 11 (1.499 m)     Head Circumference --      Peak Flow --      Pain Score 08/09/24 1041 8     Pain Loc --      Pain Education --      Exclude from Growth Chart --    No data found.  Updated Vital Signs BP (!) 144/83 (BP Location: Left Arm)   Pulse 65   Temp 98 F (36.7 C) (Oral)   Resp 20   Ht 4' 11 (1.499 m)   Wt 85 lb (38.6 kg)   LMP 07/05/2024 (Exact Date)   SpO2 96%   BMI 17.17 kg/m   Visual Acuity Right Eye Distance:   Left Eye Distance:   Bilateral Distance:    Right Eye Near:   Left Eye Near:    Bilateral Near:     Physical Exam Vitals and nursing note reviewed.  Constitutional:      General:  She is not in acute distress.    Appearance: She is well-developed. She is not ill-appearing.  HENT:     Head: Normocephalic and atraumatic.     Right Ear: Tympanic membrane and ear canal normal.     Left Ear: Tympanic membrane and ear canal normal.     Nose: Congestion present.     Mouth/Throat:     Mouth: Mucous membranes are moist.     Pharynx: Oropharynx is clear. Uvula midline. No oropharyngeal exudate or posterior oropharyngeal erythema.     Tonsils: No tonsillar exudate or tonsillar abscesses.  Eyes:     Conjunctiva/sclera: Conjunctivae normal.     Pupils: Pupils are equal, round, and reactive to light.  Cardiovascular:     Rate and Rhythm: Normal rate and regular rhythm.     Heart sounds: Normal heart sounds.  Pulmonary:     Effort: Pulmonary effort is normal.     Breath sounds: Normal breath sounds. No wheezing, rhonchi or rales.  Musculoskeletal:     Cervical back: Normal range of motion and neck supple.  Lymphadenopathy:     Cervical: No cervical adenopathy.  Skin:    General: Skin is warm and dry.  Neurological:     General: No focal deficit present.     Mental Status: She is alert and oriented to person, place, and time.  Psychiatric:        Mood and Affect: Mood normal.        Behavior: Behavior normal.      UC Treatments / Results  Labs (all labs ordered are listed, but only abnormal results are displayed) Labs Reviewed - No data to display  EKG   Radiology No results found.  Procedures Procedures (including critical care time)  Medications Ordered in UC Medications  ipratropium-albuterol  (DUONEB) 0.5-2.5 (3) MG/3ML nebulizer solution 3 mL (has no administration in time range)    Initial Impression / Assessment and Plan / UC Course  I have reviewed the triage vital signs and the nursing notes.  Pertinent labs & imaging results that were available during my care of the patient were reviewed by me and considered in my medical decision making (see  chart for details).     Reviewed exam and symptoms with patient.  No red flags.  She was given nebulizer in clinic for shortness of breath and lungs remain CTA bilaterally.  Discussed bronchitis with asthma exacerbation.  Will do prednisone  daily, Augmentin , and Flonase  at night to help with postnasal drip triggering her cough.  She can continue previously prescribed cough medicine as needed.  Encourage rest fluids and PCP follow-up 2 days for recheck.  ER precautions reviewed. Final Clinical Impressions(s) / UC Diagnoses   Final diagnoses:  Mild intermittent asthma with acute exacerbation  Acute bronchitis, unspecified organism     Discharge Instructions      Start Augmentin  twice daily for 7 days.  You may use Flonase  daily at night to help with your postnasal drip that is triggering your cough.  Start prednisone  daily for 5 days as well for your asthma symptoms.  Continue your inhaler as needed.  Lots of rest and fluids.  Follow-up with your PCP in 2 days for recheck.  Please go to the emergency room for any worsening symptoms.  Hope you feel better soon!    ED Prescriptions     Medication Sig Dispense Auth. Provider   predniSONE  (DELTASONE ) 10 MG tablet Take 3 tablets (30 mg total) by mouth daily for 5 days. 15 tablet Brandon Wiechman, Jodi R, NP   fluticasone  (FLONASE ) 50 MCG/ACT nasal spray Place 1 spray into both nostrils daily. 15.8 mL Ciara Kagan, Jodi R, NP   amoxicillin -clavulanate (AUGMENTIN ) 875-125 MG tablet Take 1 tablet by mouth every 12 (twelve) hours for 7 days. 14 tablet Delyle Weider, Jodi R, NP      PDMP not reviewed this encounter.    [1]  Social History Tobacco Use   Smoking status: Every Day   Smokeless tobacco: Never  Vaping Use   Vaping status: Never Used  Substance Use Topics   Alcohol use: No   Drug use: No     Loreda Myla SAUNDERS, NP 08/09/24 1056  "

## 2024-08-09 NOTE — ED Notes (Signed)
Pt receiving breathing tx at this time 

## 2024-08-09 NOTE — ED Triage Notes (Signed)
 Pt c/o productive cough x's 6 days also st's her lungs hurt from coughing so much

## 2024-08-09 NOTE — Discharge Instructions (Addendum)
 Start Augmentin  twice daily for 7 days.  You may use Flonase  daily at night to help with your postnasal drip that is triggering your cough.  Start prednisone  daily for 5 days as well for your asthma symptoms.  Continue your inhaler as needed.  Lots of rest and fluids.  Follow-up with your PCP in 2 days for recheck.  Please go to the emergency room for any worsening symptoms.  Hope you feel better soon!
